# Patient Record
Sex: Female | Born: 1995
Health system: Southern US, Community
[De-identification: ages and names within clinical notes are randomized; demographics above are authoritative.]

## PROBLEM LIST (undated history)

## (undated) DIAGNOSIS — O039 Complete or unspecified spontaneous abortion without complication: Secondary | ICD-10-CM

## (undated) DIAGNOSIS — Z9109 Other allergy status, other than to drugs and biological substances: Secondary | ICD-10-CM

## (undated) DIAGNOSIS — J45909 Unspecified asthma, uncomplicated: Secondary | ICD-10-CM

## (undated) HISTORY — DX: Complete or unspecified spontaneous abortion without complication: O03.9

## (undated) HISTORY — PX: NO PAST SURGERIES: SHX2092

## (undated) HISTORY — DX: Unspecified asthma, uncomplicated: J45.909

---
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
## 2017-01-22 DIAGNOSIS — H52223 Regular astigmatism, bilateral: Secondary | ICD-10-CM | POA: Diagnosis not present

## 2017-01-22 DIAGNOSIS — H5203 Hypermetropia, bilateral: Secondary | ICD-10-CM | POA: Diagnosis not present

## 2017-03-02 ENCOUNTER — Telehealth: Payer: Self-pay | Admitting: *Deleted

## 2017-03-02 MED ORDER — HYDROCODONE-HOMATROPINE 5-1.5 MG/5ML PO SYRP: ORAL_SOLUTION | ORAL | 0 refills | Status: DC

## 2017-03-02 MED ORDER — OSELTAMIVIR PHOSPHATE 75 MG PO CAPS: 75.0000 mg | ORAL_CAPSULE | Freq: Two times a day (BID) | ORAL | 0 refills | Status: AC

## 2017-03-02 NOTE — Telephone Encounter (Signed)
Mother is aware of all. 

## 2017-03-02 NOTE — Telephone Encounter (Signed)
tamiflu 75 bid for five d, calandra knows it is our po0licy to call in tamiflu if others in the immed family have been recently diagnosed, hycodan 3 oz one tspn qhs prn cough

## 2017-03-02 NOTE — Telephone Encounter (Signed)
I called and left a message asked that mother r/c.I have sent in the Tamiful to [facility] and hycodan is printed awaiting signature from Dr. Lubertha SouthSteve Luking.

## 2017-03-02 NOTE — Telephone Encounter (Signed)
Mother calandra calling to ask if tamilfu and cough syrup can be prescribed. Family recently in with flu. Having cough, bodyaches, low grade fever, coughing til chest hurts. Started yesterday.  [facility] [address]

## 2017-12-18 ENCOUNTER — Ambulatory Visit: Payer: 59 | Admitting: Family Medicine

## 2017-12-18 ENCOUNTER — Encounter: Payer: Self-pay | Admitting: Family Medicine

## 2017-12-18 VITALS — BP 120/70 | Temp 98.8°F | Ht 67.5 in | Wt 125.4 lb

## 2017-12-18 DIAGNOSIS — J111 Influenza due to unidentified influenza virus with other respiratory manifestations: Secondary | ICD-10-CM | POA: Diagnosis not present

## 2017-12-18 MED ORDER — OSELTAMIVIR PHOSPHATE 75 MG PO CAPS: 75.0000 mg | ORAL_CAPSULE | Freq: Two times a day (BID) | ORAL | 0 refills | Status: AC

## 2017-12-18 NOTE — Progress Notes (Signed)
   Subjective:    Patient ID: Emily [name], female    DOB: 06/28/1995, 22 y.o.   MRN: [id number]  Cough  This is a new problem. The current episode started yesterday. The cough is non-productive. Associated symptoms include headaches and a sore throat. Associated symptoms comments: Fatigue, body aches. She has tried nothing for the symptoms.    Throat as hurting    Real bad achiness   Cough off and on , dry    This energy level not good   Appetite   Not any    Results for orders placed or performed in visit on 09/22/16  Urine Culture  Result Value Ref Range   Urine Culture, Routine Final report (A)    Organism ID, Bacteria Klebsiella pneumoniae (A)    Antimicrobial Susceptibility Comment   POCT urinalysis dipstick  Result Value Ref Range   Color, UA     Clarity, UA     Glucose, UA     Bilirubin, UA     Ketones, UA     Spec Grav, UA 1.015 1.010 - 1.025   Blood, UA     pH, UA 6.0 5.0 - 8.0   Protein, UA     Urobilinogen, UA  0.2 or 1.0 E.U./dL   Nitrite, UA     Leukocytes, UA  Negative      Did get a flu shot this   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   Respiratory: Positive for cough.   Neurological: Positive for headaches.       Objective:   Physical Exam  Alert vitals reviewed, moderate malaise. Hydration good. Positive nasal congestion lungs no crackles or wheezes, no tachypnea, intermittent bronchial cough during exam heart regular rate and rhythm.       Assessment & Plan:  Impression influenza discussed at length. Ashby Dawesature of illness and potential sequela discussed. Plan Tamiflu prescribed if indicated and timing appropriate. Symptom care discussed. Warning signs discussed. WSL

## 2018-06-17 ENCOUNTER — Other Ambulatory Visit: Payer: Self-pay

## 2018-06-17 DIAGNOSIS — Z20822 Contact with and (suspected) exposure to covid-19: Secondary | ICD-10-CM

## 2018-06-22 LAB — NOVEL CORONAVIRUS, NAA: SARS-CoV-2, NAA: NOT DETECTED

## 2018-06-25 ENCOUNTER — Ambulatory Visit (INDEPENDENT_AMBULATORY_CARE_PROVIDER_SITE_OTHER): Payer: 59 | Admitting: Certified Nurse Midwife

## 2018-06-25 ENCOUNTER — Encounter: Payer: Self-pay | Admitting: Certified Nurse Midwife

## 2018-06-25 ENCOUNTER — Other Ambulatory Visit: Payer: Self-pay

## 2018-06-25 VITALS — BP 110/70 | HR 68 | Temp 97.8°F | Resp 16 | Ht 67.0 in | Wt 126.0 lb

## 2018-06-25 DIAGNOSIS — Z30011 Encounter for initial prescription of contraceptive pills: Secondary | ICD-10-CM | POA: Diagnosis not present

## 2018-06-25 DIAGNOSIS — Z01419 Encounter for gynecological examination (general) (routine) without abnormal findings: Secondary | ICD-10-CM

## 2018-06-25 DIAGNOSIS — Z113 Encounter for screening for infections with a predominantly sexual mode of transmission: Secondary | ICD-10-CM

## 2018-06-25 MED ORDER — NORETHIN ACE-ETH ESTRAD-FE 1-20 MG-MCG(24) PO TABS: 1.0000 | ORAL_TABLET | Freq: Every day | ORAL | 3 refills | Status: DC

## 2018-06-25 NOTE — Progress Notes (Signed)
23 y.o. G0P0000 Single  African American Fe here to establish gyn care and  for annual exam. Periods monthly, duration 5 days with light flow,mild cramping with Advil use with good results. No missed cycles in last year. Contraception condoms consistent (tries). Has read about OCP and desires to start on. Has read about OCP and feels this is good choice. Desires STD screening, first partner, but he has been sexually active. No other health issues today.  Patient's last menstrual period was 05/29/2018 (exact date).          Sexually active: Yes.    The current method of family planning is condoms most of the time.    Exercising: No.  exercise Smoker:  no  Review of Systems  Constitutional: Negative.   HENT: Negative.   Eyes: Negative.   Respiratory: Negative.   Cardiovascular: Negative.   Gastrointestinal: Negative.   Genitourinary: Negative.   Musculoskeletal: Negative.   Skin: Negative.   Neurological: Negative.   Endo/Heme/Allergies: Negative.   Psychiatric/Behavioral: Negative.     Health Maintenance: Pap:  none History of Abnormal Pap: no MMG:  none Self Breast exams: no Colonoscopy:  none BMD:   none TDaP:  2011 Shingles: no Pneumonia: no Hep C and HIV: not done Labs:  If needed   reports that she has never smoked. She has never used smokeless tobacco. She reports that she does not drink alcohol or use drugs.  Past Medical History:  Diagnosis Date  . Asthma    as a child  . Environmental allergies     History reviewed. No pertinent surgical history.  Current Outpatient Medications  Medication Sig Dispense Refill  . cetirizine (ZYRTEC) 10 MG tablet TAKE 1 TABLET BY MOUTH DAILY. 30 tablet 5  . olopatadine (PATANOL) 0.1 % ophthalmic solution Place 1 drop into both eyes 2 (two) times daily.    . Olopatadine HCl (PATADAY) 0.2 % SOLN One drop each eye once a day prn allergies 2.5 mL 11   No current facility-administered medications for this visit.     Family  History  Problem Relation Age of Onset  . Hypertension Father   . Heart attack Paternal Grandfather     ROS:  Pertinent items are noted in HPI.  Otherwise, a comprehensive ROS was negative.  Exam:   BP 110/70   Pulse 68   Temp 97.8 F (36.6 C) (Skin)   Resp 16   Ht 5\' 7"  (1.702 m)   Wt 126 lb (57.2 kg)   LMP 05/29/2018 (Exact Date)   BMI 19.73 kg/m  Height: 5\' 7"  (170.2 cm) Ht Readings from Last 3 Encounters:  06/25/18 5\' 7"  (1.702 m)  12/18/17 5' 7.5" (1.715 m)  12/09/16 5' 7.5" (1.715 m) (90 %, Z= 1.27)*   * Growth percentiles are based on CDC (Girls, 2-20 Years) data.    General appearance: alert, cooperative and appears stated age Head: Normocephalic, without obvious abnormality, atraumatic Neck: no adenopathy, supple, symmetrical, trachea midline and thyroid normal to inspection and palpation Lungs: clear to auscultation bilaterally Breasts: normal appearance, no masses or tenderness, No nipple retraction or dimpling, No nipple discharge or bleeding, No axillary or supraclavicular adenopathy, Taught monthly breast self examination Heart: regular rate and rhythm Abdomen: soft, non-tender; no masses,  no organomegaly Extremities: extremities normal, atraumatic, no cyanosis or edema Skin: Skin color, texture, turgor normal. No rashes or lesions Lymph nodes: Cervical, supraclavicular, and axillary nodes normal. No abnormal inguinal nodes palpated Neurologic: Grossly normal   Pelvic: External  genitalia:  no lesions              Urethra:  normal appearing urethra with no masses, tenderness or lesions              Bartholin's and Skene's: normal                 Vagina: normal appearing vagina with normal color and discharge, no lesions              Cervix: no cervical motion tenderness, no lesions and nulliparous appearance              Pap taken: No. Bimanual Exam:  Uterus:  normal size, contour, position, consistency, mobility, non-tender and anteverted               Adnexa: normal adnexa and no mass, fullness, tenderness               Rectovaginal: Confirms               Anus:  normal appearance  Chaperone present: yes  A:  Well Woman with normal exam  Contraception condoms desires OCP   STD screening  P:   Reviewed health and wellness pertinent to exam   Discussed risks/benefits/warning signs and expectations of OCP and importance of consistent use. Desires RX  Rx Loestrin 24 Fe with instructions and need follow up visit in 3 months to assess  Labs: GC/chlamydia, affirm, HIV, RPR, Hep C  Pap smear: no at 23   counseled on breast self exam, STD prevention, HIV risk factors and prevention, feminine hygiene, use and side effects of OCP's, adequate intake of calcium and vitamin D, diet and exercise  return annually or prn  An After Visit Summary was printed and given to the patient.

## 2018-06-26 LAB — VAGINITIS/VAGINOSIS, DNA PROBE
Candida Species: NEGATIVE
Gardnerella vaginalis: NEGATIVE
Trichomonas vaginosis: NEGATIVE

## 2018-06-27 LAB — HEP, RPR, HIV PANEL
HIV Screen 4th Generation wRfx: NONREACTIVE
Hepatitis B Surface Ag: NEGATIVE
RPR Ser Ql: NONREACTIVE

## 2018-06-28 LAB — GC/CHLAMYDIA PROBE AMP
Chlamydia trachomatis, NAA: NEGATIVE
Neisseria Gonorrhoeae by PCR: NEGATIVE

## 2018-07-21 ENCOUNTER — Other Ambulatory Visit: Payer: Self-pay

## 2018-07-21 ENCOUNTER — Ambulatory Visit (INDEPENDENT_AMBULATORY_CARE_PROVIDER_SITE_OTHER): Payer: 59 | Admitting: Nurse Practitioner

## 2018-07-21 ENCOUNTER — Encounter: Payer: Self-pay | Admitting: Nurse Practitioner

## 2018-07-21 VITALS — BP 118/68 | Temp 97.6°F | Ht 67.5 in | Wt 127.0 lb

## 2018-07-21 DIAGNOSIS — Z1159 Encounter for screening for other viral diseases: Secondary | ICD-10-CM

## 2018-07-21 DIAGNOSIS — Z111 Encounter for screening for respiratory tuberculosis: Secondary | ICD-10-CM | POA: Diagnosis not present

## 2018-07-21 DIAGNOSIS — Z0000 Encounter for general adult medical examination without abnormal findings: Secondary | ICD-10-CM | POA: Diagnosis not present

## 2018-07-21 LAB — POCT URINALYSIS DIPSTICK
Bilirubin, UA: POSITIVE
Blood, UA: POSITIVE
Spec Grav, UA: 1.015 (ref 1.010–1.025)
Urobilinogen, UA: 2 E.U./dL — AB
pH, UA: 5 (ref 5.0–8.0)

## 2018-07-21 NOTE — Progress Notes (Signed)
Subjective:    Patient ID: Emily [name], female    DOB: 1995/06/12, 23 y.o.   MRN: [id number]  HPI The patient comes in today for a wellness visit.    A review of their health history was completed.  A review of medications was also completed.  Any needed refills; none  Eating habits: health conscious  Falls/  MVA accidents in past few months: none  Regular exercise: none  Specialist pt sees on regular basis: none  Preventative health issues were discussed.   Additional concerns: none  Presents for her examination and paperwork to go to [employer].  Healthy diet.  Has an active job but otherwise no exercise.  Just had her female physical with gynecology, is on her first pack of a new oral contraceptive.  Regular vision and dental exams.  Review of Systems  Constitutional: Negative for activity change, appetite change, fatigue and fever.  HENT: Negative for dental problem, ear pain, sinus pressure and sore throat.   Respiratory: Negative for cough, chest tightness, shortness of breath and wheezing.   Cardiovascular: Negative for chest pain.  Gastrointestinal: Negative for abdominal distention, abdominal pain, constipation, diarrhea, nausea and vomiting.  Genitourinary: Negative for difficulty urinating, dysuria, enuresis, frequency, genital sores, pelvic pain, urgency and vaginal discharge.  Psychiatric/Behavioral: The patient is nervous/anxious.        Mild anxiety that is not an issue at this time.   Depression screen [facility] 2/9 07/21/2018 07/21/2018  Decreased Interest 1 0  Down, Depressed, Hopeless 0 0  PHQ - 2 Score 1 0        Objective:   Physical Exam Constitutional:      General: She is not in acute distress.    Appearance: Normal appearance. She is well-developed.  HENT:     Right Ear: External ear normal.     Left Ear: External ear normal.     Mouth/Throat:     Mouth: Mucous membranes are moist.     Pharynx: Oropharynx is clear.   Neck:     Musculoskeletal: Normal range of motion and neck supple.     Thyroid: No thyromegaly.     Trachea: No tracheal deviation.  Cardiovascular:     Rate and Rhythm: Normal rate and regular rhythm.     Heart sounds: Normal heart sounds. No murmur. No gallop.   Pulmonary:     Effort: Pulmonary effort is normal.     Breath sounds: Normal breath sounds.  Abdominal:     General: There is no distension.     Palpations: Abdomen is soft.     Tenderness: There is no abdominal tenderness.  Genitourinary:    Vagina: Normal.     Comments: Breast and GU exams deferred, recently done by gynecology. Musculoskeletal: Normal range of motion.  Lymphadenopathy:     Cervical: No cervical adenopathy.  Skin:    General: Skin is warm and dry.     Findings: No rash.  Neurological:     Mental Status: She is alert and oriented to person, place, and time.     Coordination: Coordination normal.     Gait: Gait normal.     Deep Tendon Reflexes: Reflexes normal.  Psychiatric:        Mood and Affect: Mood normal.        Behavior: Behavior normal.        Thought Content: Thought content normal.        Judgment: Judgment normal.  Assessment & Plan:     ICD-10-CM   1. Routine general medical examination at a health care facility  Z00.00 POCT urinalysis dipstick  2. Screening for tuberculosis  Z11.1 TB Skin Test  3. Screening for rubella  Z11.59 Rubella screen   Immunizations and health maintenance for her age are up-to-date.  Discussed healthy lifestyle and safe sex issues.  Paperwork completed for school.

## 2018-07-22 ENCOUNTER — Encounter: Payer: Self-pay | Admitting: Nurse Practitioner

## 2018-07-23 DIAGNOSIS — Z1159 Encounter for screening for other viral diseases: Secondary | ICD-10-CM | POA: Diagnosis not present

## 2018-07-23 LAB — TB SKIN TEST
Induration: 0 mm
TB Skin Test: NEGATIVE

## 2018-07-24 LAB — RUBELLA SCREEN: Rubella Antibodies, IGG: 5.33 index (ref >0.99)

## 2018-08-04 ENCOUNTER — Other Ambulatory Visit: Payer: Self-pay

## 2018-08-04 ENCOUNTER — Ambulatory Visit (INDEPENDENT_AMBULATORY_CARE_PROVIDER_SITE_OTHER): Payer: 59 | Admitting: Family Medicine

## 2018-08-04 DIAGNOSIS — Z111 Encounter for screening for respiratory tuberculosis: Secondary | ICD-10-CM

## 2018-08-06 LAB — TB SKIN TEST
Induration: 0 mm
TB Skin Test: NEGATIVE

## 2018-09-14 ENCOUNTER — Other Ambulatory Visit: Payer: Self-pay

## 2018-09-15 ENCOUNTER — Other Ambulatory Visit: Payer: Self-pay

## 2018-09-15 ENCOUNTER — Encounter: Payer: Self-pay | Admitting: Certified Nurse Midwife

## 2018-09-15 ENCOUNTER — Ambulatory Visit (INDEPENDENT_AMBULATORY_CARE_PROVIDER_SITE_OTHER): Payer: 59 | Admitting: Certified Nurse Midwife

## 2018-09-15 VITALS — BP 104/60 | HR 64 | Temp 97.1°F | Resp 16 | Wt 129.0 lb

## 2018-09-15 DIAGNOSIS — Z3041 Encounter for surveillance of contraceptive pills: Secondary | ICD-10-CM

## 2018-09-15 MED ORDER — NORETHIN ACE-ETH ESTRAD-FE 1-20 MG-MCG(24) PO TABS: 1.0000 | ORAL_TABLET | Freq: Every day | ORAL | 9 refills | Status: DC

## 2018-09-15 NOTE — Progress Notes (Signed)
Review of Systems  Constitutional: Negative.   HENT: Negative.   Eyes: Negative.   Respiratory: Negative.   Cardiovascular: Negative.   Gastrointestinal: Negative.   Genitourinary: Negative.   Musculoskeletal: Negative.   Skin: Negative.   Neurological: Negative.   Endo/Heme/Allergies: Negative.   Psychiatric/Behavioral: Negative.     23 y.o. Single African American G0P0000 here for evaluation of OCP initiated on 06/25/2018. Menses monthly, light flow days only now from moderate flow previously and no cramping.  No medication needed for cramping now. Patient taking medication as prescribed. Denies missed pills, headaches, nausea, DVT warning signs or symptoms,  breakthrough bleeding, or other changes. Keeping menses calendar and keeping up with taking per pack instructions. Uses alarm on phone as reminder and has not missed any pills. Sexually active no partner change. Happy with choice and education given regarding contraception choice. Back at school, some in class time now. No other health issues today  O: Healthy female, WD WN Affect: normal orientation X 3    A: History of dysmenorrhea with periods, contraception desired, Loestrin 24 working well. No contraindication to continued use.  P: Reviewed warning signs with use again and need to advise if occurs. Stressed consistent use for best response with use. Questions addressed. Rx Loestrin 24 see instructions sent to pharmacy.  Rv prn and aex   15 minutes spent with patient in face to face counseling.

## 2018-09-15 NOTE — Patient Instructions (Signed)
Oral Contraception Use Oral contraceptive pills (OCPs) are medicines that you take to prevent pregnancy. OCPs work by:  Preventing the ovaries from releasing eggs.  Thickening mucus in the lower part of the uterus (cervix), which prevents sperm from entering the uterus.  Thinning the lining of the uterus (endometrium), which prevents a fertilized egg from attaching to the endometrium. OCPs are highly effective when taken exactly as prescribed. However, OCPs do not prevent sexually transmitted infections (STIs). Safe sex practices, such as using condoms while on an OCP, can help prevent STIs. Before taking OCPs, you may have a physical exam, blood test, and Pap test. A Pap test involves taking a sample of cells from your cervix to check for cancer. Discuss with your health care provider the possible side effects of the OCP you may be prescribed. When you start an OCP, be aware that it can take 2-3 months for your body to adjust to changes in hormone levels. How to take oral contraceptive pills Follow instructions from your health care provider about how to start taking your first cycle of OCPs. Your health care provider may recommend that you:  Start the pill on day 1 of your menstrual period. If you start at this time, you will not need any backup form of birth control (contraception), such as condoms.  Start the pill on the first Sunday after your menstrual period or on the day you get your prescription. In these cases, you will need to use backup contraception for the first week.  Start the pill at any time of your cycle. ? If you take the pill within 5 days of the start of your period, you will not need a backup form of contraception. ? If you start at any other time of your menstrual cycle, you will need to use another form of contraception for 7 days. If your OCP is the type called a minipill, it will protect you from pregnancy after taking it for 2 days (48 hours), and you can stop using  backup contraception after that time. After you have started taking OCPs:  If you forget to take 1 pill, take it as soon as you remember. Take the next pill at the regular time.  If you miss 2 or more pills, call your health care provider. Different pills have different instructions for missed doses. Use backup birth control until your next menstrual period starts.  If you use a 28-day pack that contains inactive pills and you miss 1 of the last 7 pills (pills with no hormones), throw away the rest of the non-hormone pills and start a new pill pack. No matter which day you start the OCP, you will always start a new pack on that same day of the week. Have an extra pack of OCPs and a backup contraceptive method available in case you miss some pills or lose your OCP pack. Follow these instructions at home:  Do not use any products that contain nicotine or tobacco, such as cigarettes and e-cigarettes. If you need help quitting, ask your health care provider.  Always use a condom to protect against STIs. OCPs do not protect against STIs.  Use a calendar to mark the days of your menstrual period.  Read the information and directions that came with your OCP. Talk to your health care provider if you have questions. Contact a health care provider if:  You develop nausea and vomiting.  You have abnormal vaginal discharge or bleeding.  You develop a rash.    You miss your menstrual period. Depending on the type of OCP you are taking, this may be a sign of pregnancy. Ask your health care provider for more information.  You are losing your hair.  You need treatment for mood swings or depression.  You get dizzy when taking the OCP.  You develop acne after taking the OCP.  You become pregnant or think you may be pregnant.  You have diarrhea, constipation, and abdominal pain or cramps.  You miss 2 or more pills. Get help right away if:  You develop chest pain.  You develop shortness of  breath.  You have an uncontrolled or severe headache.  You develop numbness or slurred speech.  You develop visual or speech problems.  You develop pain, redness, and swelling in your legs.  You develop weakness or numbness in your arms or legs. Summary  Oral contraceptive pills (OCPs) are medicines that you take to prevent pregnancy.  OCPs do not prevent sexually transmitted infections (STIs). Always use a condom to protect against STIs.  When you start an OCP, be aware that it can take 2-3 months for your body to adjust to changes in hormone levels.  Read all the information and directions that come with your OCP. This information is not intended to replace advice given to you by your health care provider. Make sure you discuss any questions you have with your health care provider. Document Released: 12/12/2010 Document Revised: 04/16/2018 Document Reviewed: 02/04/2016 Elsevier Patient Education  2020 [employer]vier [employer].  

## 2019-01-08 ENCOUNTER — Emergency Department (HOSPITAL_COMMUNITY): Payer: 59

## 2019-01-08 ENCOUNTER — Encounter (HOSPITAL_COMMUNITY): Payer: Self-pay | Admitting: Emergency Medicine

## 2019-01-08 ENCOUNTER — Emergency Department (HOSPITAL_COMMUNITY)
Admission: EM | Admit: 2019-01-08 | Discharge: 2019-01-08 | Disposition: A | Discharge status: Home / Self Care | Payer: 59 | Attending: Emergency Medicine | Admitting: Emergency Medicine

## 2019-01-08 ENCOUNTER — Other Ambulatory Visit: Payer: Self-pay

## 2019-01-08 DIAGNOSIS — O039 Complete or unspecified spontaneous abortion without complication: Secondary | ICD-10-CM | POA: Diagnosis not present

## 2019-01-08 DIAGNOSIS — O209 Hemorrhage in early pregnancy, unspecified: Secondary | ICD-10-CM | POA: Diagnosis present

## 2019-01-08 DIAGNOSIS — J45909 Unspecified asthma, uncomplicated: Secondary | ICD-10-CM | POA: Insufficient documentation

## 2019-01-08 DIAGNOSIS — O468X1 Other antepartum hemorrhage, first trimester: Secondary | ICD-10-CM

## 2019-01-08 DIAGNOSIS — O4690 Antepartum hemorrhage, unspecified, unspecified trimester: Secondary | ICD-10-CM

## 2019-01-08 DIAGNOSIS — O418X10 Other specified disorders of amniotic fluid and membranes, first trimester, not applicable or unspecified: Secondary | ICD-10-CM

## 2019-01-08 LAB — URINALYSIS, ROUTINE W REFLEX MICROSCOPIC
Bilirubin Urine: NEGATIVE
Glucose, UA: NEGATIVE mg/dL
Ketones, ur: NEGATIVE mg/dL
Leukocytes,Ua: NEGATIVE
Nitrite: NEGATIVE
Protein, ur: NEGATIVE mg/dL
Specific Gravity, Urine: 1.005 (ref 1.005–1.030)
pH: 8 (ref 5.0–8.0)

## 2019-01-08 LAB — WET PREP, GENITAL
Sperm: NONE SEEN
Trich, Wet Prep: NONE SEEN
Yeast Wet Prep HPF POC: NONE SEEN

## 2019-01-08 LAB — HCG, QUANTITATIVE, PREGNANCY: hCG, Beta Chain, Quant, S: 4579 m[IU]/mL — ABNORMAL HIGH (ref <5)

## 2019-01-08 LAB — PREGNANCY, URINE: Preg Test, Ur: POSITIVE — AB

## 2019-01-08 NOTE — ED Notes (Signed)
To US

## 2019-01-08 NOTE — ED Notes (Signed)
Returned from [place]  Per [place] tech pt miscarried while being scanned  POC taken to lab for preservation  PA informed

## 2019-01-08 NOTE — ED Notes (Signed)
POC to lab for formalin  POC returned to NS   PA informed

## 2019-01-08 NOTE — ED Provider Notes (Signed)
[facility] EMERGENCY DEPARTMENT Provider Note   CSN: [id number] Arrival date & time: 01/08/19  1211     History Chief Complaint  Patient presents with  . Vaginal Bleeding    Emily [name] is a 24 y.o. female presents to ER for concern of having a miscarriage.  LMP November 22 2018.  On Monday 12/28 has 2 positive at home pregnancy tests.  Last night noticed vaginal bleeding described as streaks of dark brown and light pink bleeding with wiping.  She urinated this morning and when she wiped she noticed brighter red streaks.  She has a liner now but no more bleeding.  Has associated pelvic cramping since last month that worsened yesterday. Cramping in lower generalized mild and intermittent, none currently.  Mild nausea and dizziness on Monday but none since.  Unplanned pregnancy.  First pregnancy.  She was on loestrin but stopped this after her last period.  She denies fevers. No UTI symptoms. No changes in BM. No pelvic or abdominal surgeries. No known h/o ovarian cysts, endometriosis, fibroids.  No Cp, Sob, light headedness. No modifying factors. No interventions.  HPI     Past Medical History:  Diagnosis Date  . Asthma    as a child  . Environmental allergies     There are no problems to display for this patient.   History reviewed. No pertinent surgical history.   OB History    Gravida  1   Para  0   Term  0   Preterm  0   AB  0   Living  0     SAB  0   TAB  0   Ectopic  0   Multiple  0   Live Births  0           Family History  Problem Relation Age of Onset  . Hypertension Father   . Heart attack Paternal Grandfather     Social History   Tobacco Use  . Smoking status: Never Smoker  . Smokeless tobacco: Never Used  Substance Use Topics  . Alcohol use: No  . Drug use: No    Home Medications Prior to Admission medications   Medication Sig Start Date End Date Taking? Authorizing Provider  cetirizine (ZYRTEC) 10 MG tablet TAKE 1 TABLET  BY MOUTH DAILY. Patient not taking: Reported on 01/08/2019 03/26/16   Merlyn Albert, MD  Norethindrone Acetate-Ethinyl Estrad-FE (LOESTRIN 24 FE) 1-20 MG-MCG(24) tablet Take 1 tablet by mouth daily. supply with  3 refills if insurance allows Patient not taking: Reported on 01/08/2019 09/15/18   Verner Chol, CNM    Allergies    Patient has no known allergies.  Review of Systems   Review of Systems  Genitourinary: Positive for menstrual problem, pelvic pain and vaginal bleeding.  All other systems reviewed and are negative.   Physical Exam Updated Vital Signs BP 115/69 (BP Location: Right Arm)   Pulse 86   Temp 97.8 F (36.6 C) (Oral)   Resp 16   Ht 5\' 8"  (1.727 m)   Wt 59 kg   LMP 11/22/2018   SpO2 97%   BMI 19.77 kg/m   Physical Exam Vitals and nursing note reviewed. Exam conducted with a chaperone present.  Constitutional:      Appearance: She is well-developed.     Comments: Non toxic in NAD  HENT:     Head: Normocephalic and atraumatic.     Nose: Nose normal.  Eyes:  Conjunctiva/sclera: Conjunctivae normal.  Cardiovascular:     Rate and Rhythm: Normal rate and regular rhythm.  Pulmonary:     Effort: Pulmonary effort is normal.     Breath sounds: Normal breath sounds.  Abdominal:     General: Bowel sounds are normal.     Palpations: Abdomen is soft.     Tenderness: There is no abdominal tenderness.     Comments: No lower abd/pelvic tenderness. No G/R/R.   Genitourinary:    Cervix: Cervical bleeding present.     Comments:  Exam performed with RN at bedside for assistance. External genitalia without lesions.  No groin lymphadenopathy.  Vaginal mucosa and cervix pink without lesions.  Cervical os visualized slightly open with blood coming through.  No friability or cervical lesions, polyps, tears or other abnormalities. Moderate amount of dark blood in vaginal vault.  No CMT.  Nonpalpable, nontender adnexa.  Perianal skin normal without  lesions. Musculoskeletal:        General: Normal range of motion.     Cervical back: Normal range of motion.  Skin:    General: Skin is warm and dry.     Capillary Refill: Capillary refill takes less than 2 seconds.  Neurological:     Mental Status: She is alert.  Psychiatric:        Behavior: Behavior normal.     ED Results / Procedures / Treatments   Labs (all labs ordered are listed, but only abnormal results are displayed) Labs Reviewed  WET PREP, GENITAL - Abnormal; Notable for the following components:      Result Value   Clue Cells Wet Prep HPF POC PRESENT (*)    WBC, Wet Prep HPF POC PRESENT (*)    All other components within normal limits  URINALYSIS, ROUTINE W REFLEX MICROSCOPIC - Abnormal; Notable for the following components:   Color, Urine STRAW (*)    Hgb urine dipstick MODERATE (*)    Bacteria, UA RARE (*)    All other components within normal limits  PREGNANCY, URINE - Abnormal; Notable for the following components:   Preg Test, Ur POSITIVE (*)    All other components within normal limits  HCG, QUANTITATIVE, PREGNANCY - Abnormal; Notable for the following components:   hCG, Beta Chain, Quant, S 4,579 (*)    All other components within normal limits  URINE CULTURE  GC/CHLAMYDIA PROBE AMP () NOT AT [place]    EKG None  Radiology US OB LESS THAN 14 WEEKS WITH OB TRANSVAGINAL  Result Date: 01/08/2019 CLINICAL DATA:  Pregnancy, vaginal bleeding. EXAM: OBSTETRIC <14 WK [place] AND TRANSVAGINAL OB US TECHNIQUE: Both transabdominal and transvaginal ultrasound examinations were performed for complete evaluation of the gestation as well as the maternal uterus, adnexal regions, and pelvic cul-de-sac. Transvaginal technique was performed to assess early pregnancy. COMPARISON:  None. FINDINGS: Intrauterine gestational sac and embryo: Gestational sac and probable fetal pole were noted within the endometrial space on the initial transabdominal  images. However, during transvaginal imaging, these are seen to be moving toward the lower uterine segment. The technologist reports that these contents were expelled during the exam, and they were collected and delivered to pathology. Post imaging demonstrates no residual gestational sac within the endometrial space. Yolk sac:  Not Visualized. Cardiac Activity: Not Visualized. Subchorionic hemorrhage:  Large subchorionic hemorrhage is noted. Maternal uterus/adnexae: Ovaries are unremarkable. Trace amount of free fluid is noted in the cholestatic which most likely is physiologic. IMPRESSION: Probable gestational sac and possible  fetal pole were initially identified within the endometrial space, but were eventually expelled during the course of this examination concerning for spontaneous abortion. Large subchorionic hemorrhage is noted. The expelled endometrial contents were reportedly collected and sent to pathology. Electronically Signed   By: Lupita Raider M.D.   On: 01/08/2019 15:35    Procedures Procedures (including critical care time)  Medications Ordered in ED Medications - No data to display  ED Course  I have reviewed the triage vital signs and the nursing notes.  Pertinent labs & imaging results that were available during my care of the patient were reviewed by me and considered in my medical decision making (see chart for details).  Clinical Course as of Jan 08 1748  Sat Jan 08, 2019  1409 RN had pt obtain self swab for wet prep, wet prep swab recollected by me during pelvic exam   Wet prep, genital(!) [CG]  1747 Probable gestational sac and possible fetal pole were initially identified within the endometrial space, but were eventually expelled during the course of this examination concerning for spontaneous abortion. Large subchorionic hemorrhage is noted. The expelled endometrial contents were reportedly collected and sent to pathology.   US OB LESS THAN 14 WEEKS WITH OB  TRANSVAGINAL [CG]    Clinical Course User Index [CG] Liberty Handy, PA-C   MDM Rules/Calculators/A&P                      Positive pregnancy test. HCG 4579.  TVUS showed initially gestational sac and possible fetal pole but during ultrasound patient expelled POC.  POC collected and sent to pathology.  On repeat examination no GS noted.  Large subchorionic hemorrhage was noted as well.  UA without signs of infection. Wet prep has clue cells but pt has not had any symptoms of BV and will defer treatment at this time.    Pt has no abd tenderness, HD stability, fever or signs to suggest heavy vaginal hemorrhage to warrant more blood work or imaging.   Explained findings to patient most suggestive of spontaneous miscarriage.    She is to f/u with OB in the next 2-3 days for repeat HCG levels, reassessment. She has scheduled appointment next Wednesday.  Explain likely some vaginal bleeding over the next few days, recommended pelvic rest, NSAIDs. Return precautions given. She is in agreement with POC.   Final Clinical Impression(s) / ED Diagnoses Final diagnoses:  Vaginal bleeding in pregnancy  Spontaneous miscarriage  Subchorionic hematoma in first trimester, single or unspecified fetus    Rx / DC Orders ED Discharge Orders    None       Jerrell Mylar 01/08/19 1750    Benjiman Core, MD 01/09/19 [phone]

## 2019-01-08 NOTE — ED Triage Notes (Signed)
Pt states she had positive pregnancy test on Monday and started spotting last night with light bleeding today.

## 2019-01-08 NOTE — Discharge Instructions (Signed)
You were seen in the ER for vaginal bleeding  Ultrasound is highly suggestive of spontaneous miscarriage  Products were sent to pathology  You need re-evaluation and repeat HCG labs in the next 2-4 days, follow up with your OB appointment  Return to ER for worsening bleeding, pain, fever, chest pain shortness of breath passing out   You may check on today's work up and labs on [employer]

## 2019-01-10 LAB — URINE CULTURE: Culture: NO GROWTH

## 2019-01-10 LAB — GC/CHLAMYDIA PROBE AMP (~~LOC~~) NOT AT ARMC
Chlamydia: NEGATIVE
Neisseria Gonorrhea: NEGATIVE

## 2019-01-11 LAB — SURGICAL PATHOLOGY

## 2019-01-12 ENCOUNTER — Encounter: Payer: Self-pay | Admitting: Adult Health

## 2019-01-12 ENCOUNTER — Other Ambulatory Visit: Payer: Self-pay

## 2019-01-12 ENCOUNTER — Ambulatory Visit (INDEPENDENT_AMBULATORY_CARE_PROVIDER_SITE_OTHER): Payer: 59 | Admitting: Adult Health

## 2019-01-12 VITALS — BP 115/75 | HR 83 | Ht 68.0 in | Wt 128.0 lb

## 2019-01-12 DIAGNOSIS — O039 Complete or unspecified spontaneous abortion without complication: Secondary | ICD-10-CM

## 2019-01-12 DIAGNOSIS — Z3201 Encounter for pregnancy test, result positive: Secondary | ICD-10-CM

## 2019-01-12 LAB — POCT URINE PREGNANCY: Preg Test, Ur: POSITIVE — AB

## 2019-01-12 NOTE — Progress Notes (Signed)
Patient ID: PALOMA GRANGE, female   DOB: 12-Mar-1995, 24 y.o.   MRN: [id number] History of Present Illness:  Dianelys is a 24 year old black female, single, G1P0010, here in follow up of being seen in ER 01/08/19 at [facility], [facility] was 4,579 and pathology was +POC, GC/CHL was negative.  PCP is [facility].   Current Medications, Allergies, Past Medical History, Past Surgical History, Family History and Social History were reviewed in [employer] record.     Review of Systems: Has bleeding still  No pain No sex since miscarriage    Physical Exam:BP 115/75 (BP Location: Left Arm, Patient Position: Sitting, Cuff Size: Normal)   Pulse 83   Ht 5\' 8"  (1.727 m)   Wt 128 lb (58.1 kg)   LMP 11/22/2018   Breastfeeding Unknown   BMI 19.46 kg/m UPT + General:  Well developed, well nourished, no acute distress Skin:  Warm and dry Neck:  Midline trachea, normal thyroid, good ROM, no lymphadenopathy Lungs; Clear to auscultation bilaterally  Cardiovascular: Regular rate and rhythm Pelvic:  External genitalia is normal in appearance, no lesions.  The vagina is normal in appearance,pink discharge with out odor. Urethra has no lesions or masses. The cervix is smooth and no CMT.  Uterus is felt to be normal size, shape, and contour.  No adnexal masses or tenderness noted.Bladder is non tender, no masses felt. Extremities/musculoskeletal:  No swelling or varicosities noted, no clubbing or cyanosis Psych:  No mood changes, alert and cooperative,seems happy Fall risk I slow PHQ 2 score 0. Examination chaperoned by 11/24/2018 LPN.  Impression and Plan: 1. Positive pregnancy test Discussed can be positive for a while after miscarriage   2. Miscarriage Check QHCG and ABO RH 01/17/19 No sex for now Will start OCs when [facility] drops further, she already has a pack  Follow up in 4 weeks or sooner if needed

## 2019-01-17 DIAGNOSIS — O039 Complete or unspecified spontaneous abortion without complication: Secondary | ICD-10-CM | POA: Diagnosis not present

## 2019-01-18 ENCOUNTER — Other Ambulatory Visit: Payer: Self-pay | Admitting: Adult Health

## 2019-01-18 DIAGNOSIS — O039 Complete or unspecified spontaneous abortion without complication: Secondary | ICD-10-CM

## 2019-01-18 LAB — ABO/RH: Rh Factor: POSITIVE

## 2019-01-18 LAB — BETA HCG QUANT (REF LAB): hCG Quant: 19 m[IU]/mL

## 2019-01-24 ENCOUNTER — Telehealth: Payer: 59 | Admitting: Physician Assistant

## 2019-01-24 DIAGNOSIS — R399 Unspecified symptoms and signs involving the genitourinary system: Secondary | ICD-10-CM | POA: Diagnosis not present

## 2019-01-24 DIAGNOSIS — R300 Dysuria: Secondary | ICD-10-CM

## 2019-01-24 MED ORDER — NITROFURANTOIN MONOHYD MACRO 100 MG PO CAPS: 100.0000 mg | ORAL_CAPSULE | Freq: Two times a day (BID) | ORAL | 0 refills | Status: AC

## 2019-01-24 NOTE — Progress Notes (Signed)

## 2019-02-02 ENCOUNTER — Encounter: Payer: Self-pay | Admitting: Family Medicine

## 2019-02-03 ENCOUNTER — Encounter: Payer: Self-pay | Admitting: Family Medicine

## 2019-02-08 DIAGNOSIS — Z23 Encounter for immunization: Secondary | ICD-10-CM | POA: Diagnosis not present

## 2019-02-10 ENCOUNTER — Ambulatory Visit (INDEPENDENT_AMBULATORY_CARE_PROVIDER_SITE_OTHER): Payer: 59 | Admitting: Adult Health

## 2019-02-10 ENCOUNTER — Other Ambulatory Visit: Payer: Self-pay

## 2019-02-10 ENCOUNTER — Encounter: Payer: Self-pay | Admitting: Adult Health

## 2019-02-10 VITALS — BP 112/72 | HR 86 | Ht 67.0 in | Wt 128.5 lb

## 2019-02-10 DIAGNOSIS — Z3201 Encounter for pregnancy test, result positive: Secondary | ICD-10-CM | POA: Diagnosis not present

## 2019-02-10 DIAGNOSIS — O039 Complete or unspecified spontaneous abortion without complication: Secondary | ICD-10-CM | POA: Diagnosis not present

## 2019-02-10 LAB — POCT URINE PREGNANCY: Preg Test, Ur: POSITIVE — AB

## 2019-02-10 NOTE — Progress Notes (Signed)
  Subjective:     Patient ID: Emily [name], female   DOB: [birth year], 24 y.o.   MRN: [id number]  HPI Emily [name] is a 24 year old black female, single, back in follow up on recent miscarriage and did not get QHCG  Last week.On 01/17/19 QHCG was 19 and blood type is A+. PCP is [facility].   Review of Systems Patient denies any headaches, hearing loss, fatigue, blurred vision, shortness of breath, chest pain, abdominal pain, problems with bowel movements, urination, or intercourse. No joint pain or mood swings. Reviewed past medical,surgical, social and family history. Reviewed medications and allergies.     Objective:   Physical Exam BP 112/72 (BP Location: Left Arm, Patient Position: Sitting, Cuff Size: Normal)   Pulse 86   Ht 5\' 7"  (1.702 m)   Wt 128 lb 8 oz (58.3 kg)   Breastfeeding No   BMI 20.13 kg/m UPT + Skin warm and dry. Lungs: clear to ausculation bilaterally. Cardiovascular: regular rate and rhythm.   Fall risk is low PHQ 2 score is 0.  Assessment:     1. Pregnancy examination or test, positive result   2. Miscarriage Check [facility] today, will communicate tomorrow when results back  Use condoms    Plan:     Follow up prn

## 2019-02-11 ENCOUNTER — Telehealth: Payer: Self-pay | Admitting: Adult Health

## 2019-02-11 DIAGNOSIS — Z3201 Encounter for pregnancy test, result positive: Secondary | ICD-10-CM

## 2019-02-11 LAB — BETA HCG QUANT (REF LAB): hCG Quant: 254 m[IU]/mL

## 2019-02-11 NOTE — Telephone Encounter (Signed)
Left message that [facility] was 254 which is elevated, from 19 3 weeks ago, need to recheck [facility] order is in, just go to the lab, looks like you got pregnant again, very soon

## 2019-02-14 DIAGNOSIS — Z3201 Encounter for pregnancy test, result positive: Secondary | ICD-10-CM | POA: Diagnosis not present

## 2019-02-15 ENCOUNTER — Other Ambulatory Visit: Payer: Self-pay | Admitting: Adult Health

## 2019-02-15 DIAGNOSIS — Z3201 Encounter for pregnancy test, result positive: Secondary | ICD-10-CM

## 2019-02-15 LAB — BETA HCG QUANT (REF LAB): hCG Quant: 1277 m[IU]/mL

## 2019-02-16 DIAGNOSIS — Z3201 Encounter for pregnancy test, result positive: Secondary | ICD-10-CM | POA: Diagnosis not present

## 2019-02-17 ENCOUNTER — Telehealth: Payer: Self-pay | Admitting: Adult Health

## 2019-02-17 LAB — BETA HCG QUANT (REF LAB): hCG Quant: 2841 m[IU]/mL

## 2019-02-17 NOTE — Telephone Encounter (Signed)
Patient called, requesting an appointment for an ultrasound.  Does she need one and if so, is it a dating?  [phone]

## 2019-02-21 ENCOUNTER — Other Ambulatory Visit: Payer: Self-pay | Admitting: Adult Health

## 2019-02-21 DIAGNOSIS — O3680X0 Pregnancy with inconclusive fetal viability, not applicable or unspecified: Secondary | ICD-10-CM

## 2019-02-22 ENCOUNTER — Other Ambulatory Visit: Payer: Self-pay

## 2019-02-22 ENCOUNTER — Ambulatory Visit (INDEPENDENT_AMBULATORY_CARE_PROVIDER_SITE_OTHER): Payer: 59

## 2019-02-22 DIAGNOSIS — Z3A01 Less than 8 weeks gestation of pregnancy: Secondary | ICD-10-CM | POA: Diagnosis not present

## 2019-02-22 DIAGNOSIS — O3680X0 Pregnancy with inconclusive fetal viability, not applicable or unspecified: Secondary | ICD-10-CM | POA: Diagnosis not present

## 2019-02-22 NOTE — Progress Notes (Signed)
[place] 5+5 wks single IUP with YS,FHR 97 bpm,normal ovaries,crl 2.29 mm

## 2019-02-25 ENCOUNTER — Telehealth: Payer: Self-pay | Admitting: *Deleted

## 2019-02-25 NOTE — Telephone Encounter (Signed)
I called patient back she states that she is seeing bleeding when she wipes. Pt denies any recent intercourse or vaginal exams. Advised that she monitor her symptoms and call us back if needed. Advised that she has IUP on ultrasound.

## 2019-03-19 ENCOUNTER — Encounter (HOSPITAL_COMMUNITY): Payer: Self-pay | Admitting: Emergency Medicine

## 2019-03-19 ENCOUNTER — Emergency Department (HOSPITAL_COMMUNITY)
Admission: EM | Admit: 2019-03-19 | Discharge: 2019-03-19 | Disposition: A | Discharge status: Home / Self Care | Payer: 59 | Attending: Emergency Medicine | Admitting: Emergency Medicine

## 2019-03-19 ENCOUNTER — Other Ambulatory Visit: Payer: Self-pay

## 2019-03-19 DIAGNOSIS — K0889 Other specified disorders of teeth and supporting structures: Secondary | ICD-10-CM | POA: Diagnosis not present

## 2019-03-19 DIAGNOSIS — Z3A09 9 weeks gestation of pregnancy: Secondary | ICD-10-CM | POA: Insufficient documentation

## 2019-03-19 DIAGNOSIS — O26891 Other specified pregnancy related conditions, first trimester: Secondary | ICD-10-CM | POA: Insufficient documentation

## 2019-03-19 DIAGNOSIS — O99891 Other specified diseases and conditions complicating pregnancy: Secondary | ICD-10-CM | POA: Diagnosis not present

## 2019-03-19 MED ORDER — AMOXICILLIN 250 MG PO CAPS
500.0000 mg | ORAL_CAPSULE | Freq: Once | ORAL | Status: AC
  Administered 2019-03-19: 500 mg via ORAL
  Filled 2019-03-19: qty 2

## 2019-03-19 MED ORDER — AMOXICILLIN 500 MG PO CAPS: 500.0000 mg | ORAL_CAPSULE | Freq: Three times a day (TID) | ORAL | 0 refills | Status: DC

## 2019-03-19 NOTE — ED Provider Notes (Signed)
[facility] EMERGENCY DEPARTMENT Provider Note   CSN: [id number] Arrival date & time: 03/19/19  1943     History Chief Complaint  Patient presents with  . Dental Pain    Emily [name] is a 24 y.o. female.  HPI      Emily [name] is a 24 y.o. female G2P0 at [redacted] weeks gestation, who presents to the Emergency Department complaining of left upper dental pain for one week, worse since last evening and now associated with mild swelling of the left face. symptoms began after a filling fell out.   She describes a sharp throbbing pain of her left cheek that radiates to into her left ear.  She has been taking Tylenol with some relief.  She is also use an over-the-counter temporary filling which has provided some relief of pain as well.  She denies fever, chills, difficulty swallowing or breathing, and neck pain.  She denies any pregnancy related symptoms.   Past Medical History:  Diagnosis Date  . Asthma    as a child  . Environmental allergies   . Miscarriage     Patient Active Problem List   Diagnosis Date Noted  . Pregnancy examination or test, positive result 02/10/2019  . Miscarriage 01/12/2019  . Positive pregnancy test 01/12/2019    History reviewed. No pertinent surgical history.   OB History    Gravida  2   Para  0   Term  0   Preterm  0   AB  1   Living  0     SAB  1   TAB  0   Ectopic  0   Multiple  0   Live Births  0           Family History  Problem Relation Age of Onset  . Hypertension Father   . Heart attack Paternal Grandfather     Social History   Tobacco Use  . Smoking status: Never Smoker  . Smokeless tobacco: Never Used  Substance Use Topics  . Alcohol use: No  . Drug use: No    Home Medications Prior to Admission medications   Medication Sig Start Date End Date Taking? Authorizing Provider  acetaminophen (TYLENOL) 500 MG tablet Take 500 mg by mouth every 6 (six) hours as needed.   Yes [provider]     Allergies    Patient has no known allergies.  Review of Systems   Review of Systems  Constitutional: Negative for appetite change and fever.  HENT: Positive for dental problem, ear pain and facial swelling. Negative for congestion, sore throat and trouble swallowing.   Eyes: Negative for pain and visual disturbance.  Gastrointestinal: Negative for abdominal pain, nausea and vomiting.  Musculoskeletal: Negative for neck pain and neck stiffness.  Skin: Negative for rash.  Neurological: Negative for dizziness, facial asymmetry and headaches.  Hematological: Negative for adenopathy.    Physical Exam Updated Vital Signs BP 128/80 (BP Location: Right Arm)   Pulse 87   Temp 99.2 F (37.3 C) (Oral)   Resp 14   Ht 5\' 8"  (1.727 m)   Wt 59 kg   LMP  (LMP Unknown)   SpO2 100%   BMI 19.77 kg/m   Physical Exam Vitals and nursing note reviewed.  Constitutional:      General: She is not in acute distress.    Appearance: Normal appearance. She is well-developed.  HENT:     Head: Normocephalic and atraumatic.     Jaw:  No trismus.     Right Ear: Tympanic membrane and ear canal normal.     Left Ear: Tympanic membrane and ear canal normal.     Mouth/Throat:     Mouth: Mucous membranes are moist. No oral lesions.     Dentition: Dental tenderness present. No gingival swelling, dental caries or dental abscesses.     Pharynx: Oropharynx is clear. Uvula midline. No oropharyngeal exudate, posterior oropharyngeal erythema or uvula swelling.     Comments: ttp of the left upper first molar.  There is a temoprary filling present.  No erythema or fluctuance of the surrounding gingiva. Uvula is midline and non-edematous.  No trismus.  Also, no appreciable facial edema noted.    Cardiovascular:     Rate and Rhythm: Normal rate and regular rhythm.     Pulses: Normal pulses.     Heart sounds: No murmur.  Pulmonary:     Effort: Pulmonary effort is normal.     Breath sounds: Normal breath sounds.   Musculoskeletal:        General: Normal range of motion.     Cervical back: Normal range of motion and neck supple.  Lymphadenopathy:     Cervical: No cervical adenopathy.  Skin:    General: Skin is warm.     Findings: No rash.  Neurological:     General: No focal deficit present.     Mental Status: She is alert.     Sensory: No sensory deficit.     Motor: No weakness or abnormal muscle tone.     ED Results / Procedures / Treatments   Labs (all labs ordered are listed, but only abnormal results are displayed) Labs Reviewed - No data to display  EKG None  Radiology No results found.  Procedures Procedures (including critical care time)  Medications Ordered in ED Medications - No data to display  ED Course  I have reviewed the triage vital signs and the nursing notes.  Pertinent labs & imaging results that were available during my care of the patient were reviewed by me and considered in my medical decision making (see chart for details).    MDM Rules/Calculators/A&P                      Pt with dental pain.  No trismus, dental abscess or significant facial swelling.  No concerning symptoms for Ludwig's angina.  She is [redacted] weeks pregnant and has routine prenatal care.  No pregnancy related complaints tonight.  She agrees to continue tylenol for pain, I will prescribe amoxil and close f/u with dentistry.     Final Clinical Impression(s) / ED Diagnoses Final diagnoses:  Pain, dental    Rx / DC Orders ED Discharge Orders    None       Bufford Lope 03/19/19 2042    Margette Fast, MD 03/20/19 1322

## 2019-03-19 NOTE — ED Triage Notes (Addendum)
Pt c/o left, upper dental pain that started last night. Pt states she took tylenol this AM that relieved the pain for a little while. Pt is currently [redacted] weeks pregnant.

## 2019-03-19 NOTE — Discharge Instructions (Addendum)
Continue taking Tylenol, 500 mg every 4 hours as needed for pain.  This is safe to take during your pregnancy, but you will need to avoid taking ibuprofen, aspirin, or Aleve.  Contact one of the dentists listed to arrange a follow-up appointment.

## 2019-03-21 ENCOUNTER — Encounter: Payer: Self-pay | Admitting: Certified Nurse Midwife

## 2019-03-23 ENCOUNTER — Encounter: Payer: Self-pay | Admitting: Certified Nurse Midwife

## 2019-04-11 ENCOUNTER — Other Ambulatory Visit: Payer: Self-pay | Admitting: Obstetrics and Gynecology

## 2019-04-11 DIAGNOSIS — Z3682 Encounter for antenatal screening for nuchal translucency: Secondary | ICD-10-CM

## 2019-04-12 ENCOUNTER — Ambulatory Visit (INDEPENDENT_AMBULATORY_CARE_PROVIDER_SITE_OTHER): Payer: 59 | Admitting: Women's Health

## 2019-04-12 ENCOUNTER — Encounter: Payer: Self-pay | Admitting: Women's Health

## 2019-04-12 ENCOUNTER — Ambulatory Visit (INDEPENDENT_AMBULATORY_CARE_PROVIDER_SITE_OTHER): Payer: 59

## 2019-04-12 ENCOUNTER — Ambulatory Visit: Payer: 59 | Admitting: *Deleted

## 2019-04-12 ENCOUNTER — Other Ambulatory Visit: Payer: Self-pay

## 2019-04-12 VITALS — BP 122/66 | HR 81 | Wt 129.0 lb

## 2019-04-12 DIAGNOSIS — Z3A12 12 weeks gestation of pregnancy: Secondary | ICD-10-CM

## 2019-04-12 DIAGNOSIS — Z3480 Encounter for supervision of other normal pregnancy, unspecified trimester: Secondary | ICD-10-CM | POA: Diagnosis not present

## 2019-04-12 DIAGNOSIS — Z3A13 13 weeks gestation of pregnancy: Secondary | ICD-10-CM | POA: Diagnosis not present

## 2019-04-12 DIAGNOSIS — Z3682 Encounter for antenatal screening for nuchal translucency: Secondary | ICD-10-CM | POA: Diagnosis not present

## 2019-04-12 DIAGNOSIS — Z3481 Encounter for supervision of other normal pregnancy, first trimester: Secondary | ICD-10-CM

## 2019-04-12 DIAGNOSIS — Z363 Encounter for antenatal screening for malformations: Secondary | ICD-10-CM

## 2019-04-12 DIAGNOSIS — Z31430 Encounter of female for testing for genetic disease carrier status for procreative management: Secondary | ICD-10-CM | POA: Diagnosis not present

## 2019-04-12 NOTE — Progress Notes (Signed)
INITIAL OBSTETRICAL VISIT Patient name: Emily [name] MRN [id number]  Date of birth: 02/22/1995 Chief Complaint:   Initial Prenatal Visit  History of Present Illness:   Emily [name] is a 24 y.o. G13P0010 African American female at [redacted]w[redacted]d by 5wk u/s, with an Estimated Date of Delivery: 10/20/19 being seen today for her initial obstetrical visit.   Her obstetrical history is significant for SAB x 1.   Today she reports low back pain into Rt buttocks.  Depression screen [facility] 2/9 04/12/2019 01/12/2019  Decreased Interest 0 0  Down, Depressed, Hopeless 0 0  PHQ - 2 Score 0 0  Altered sleeping 0 -  Tired, decreased energy 0 -  Change in appetite 0 -  Feeling bad or failure about yourself  0 -  Trouble concentrating 0 -  Moving slowly or fidgety/restless 0 -  Suicidal thoughts 0 -  PHQ-9 Score 0 -  Difficult doing work/chores Not difficult at all -  Some encounter information is confidential and restricted. Go to Review Flowsheets activity to see all data.    No LMP recorded (lmp unknown). Patient is pregnant. Last pap never, turned 23yo in Nov. Results were: n/a-wants pap next visit Review of Systems:   Pertinent items are noted in HPI Denies cramping/contractions, leakage of fluid, vaginal bleeding, abnormal vaginal discharge w/ itching/odor/irritation, headaches, visual changes, shortness of breath, chest pain, abdominal pain, severe nausea/vomiting, or problems with urination or bowel movements unless otherwise stated above.  Pertinent History Reviewed:  Reviewed past medical,surgical, social, obstetrical and family history.  Reviewed problem list, medications and allergies. OB History  Gravida Para Term Preterm AB Living  2 0 0 0 1 0  SAB TAB Ectopic Multiple Live Births  1 0 0 0 0    # Outcome Date GA Lbr Len/2nd Weight Sex Delivery Anes PTL Lv  2 Current           1 SAB 01/2019           Physical Assessment:   Vitals:   04/12/19 1045  BP: 122/66  Pulse: 81  Weight:  129 lb (58.5 kg)  Body mass index is 19.61 kg/m.       Physical Examination:  General appearance - well appearing, and in no distress  Mental status - alert, oriented to person, place, and time  Psych:  She has a normal mood and affect  Skin - warm and dry, normal color, no suspicious lesions noted  Chest - effort normal, all lung fields clear to auscultation bilaterally  Heart - normal rate and regular rhythm  Abdomen - soft, nontender  Extremities:  No swelling or varicosities noted  Thin prep pap is not done   TODAY'S NT [place] 12+5 wks,measurements 72.06 mm,fhr 152 bpm,normal ovaries,NB present,NT 1.2 mm,posterior placenta   No results found for this or any previous visit (from the past 24 hour(s)).  Assessment & Plan:  1) Low-Risk Pregnancy G2P0010 at [redacted]w[redacted]d with an Estimated Date of Delivery: 10/20/19   2) Initial OB visit  3) LBP/Sciatica> gave printed exercises  Meds: No orders of the defined types were placed in this encounter.   Initial labs obtained Continue prenatal vitamins Reviewed n/v relief measures and warning s/s to report Reviewed recommended weight gain based on pre-gravid BMI Encouraged well-balanced diet Genetic Screening discussed: requested nt/it, Panorama drawn by Tish, RN Carrier screening discussed requested Horizon 14 drawn by Tish, RN Ultrasound discussed; fetal survey: requested CCNC completed>PCM not here, form faxed The nature of  [place] for [facility] with multiple MDs and other Advanced Practice Providers was explained to patient; also emphasized that fellows, residents, and students are part of our team. Does not have home bp cuff. To buy one if desires to do online visits. Check bp weekly, let us know if >140/90.   Follow-up: Return in about 5 weeks (around 05/19/2019) for [place] w/ pap, 2nd IT, YK:DXIPJAS, in person, CNM.   Orders Placed This Encounter  Procedures  . Urine Culture  . GC/Chlamydia Probe Amp  . US OB Comp +  14 Wk  . Integrated 1  . Obstetric Panel, Including HIV  . Hepatitis C antibody  . POC Urinalysis Dipstick OB    Roma Schanz CNM, [facility] 04/12/2019 11:44 AM

## 2019-04-12 NOTE — Progress Notes (Signed)
[place] 12+5 wks,measurements 72.06 mm,fhr 152 bpm,normal ovaries,NB present,NT 1.2 mm,posterior placenta

## 2019-04-12 NOTE — Patient Instructions (Addendum)
Emily [name], I greatly value your feedback.  If you receive a survey following your visit with [place] today, we appreciate you taking the time to fill it out.  Thanks, Joellyn Haff, CNM, WHNP-BC   [facility] ([address] [place], [place] [address]) Entrance C, located off of [address] 24/7 valet parking   Nausea & Vomiting  Have saltine crackers or pretzels by your bed and eat a few bites before you raise your head out of bed in the morning  Eat small frequent meals throughout the day instead of large meals  Drink plenty of fluids throughout the day to stay hydrated, just don't drink a lot of fluids with your meals.  This can make your stomach fill up faster making you feel sick  Do not brush your teeth right after you eat  Products with real ginger are good for nausea, like ginger ale and ginger hard candy Make sure it says made with real ginger!  Sucking on sour candy like lemon heads is also good for nausea  If your prenatal vitamins make you nauseated, take them at night so you will sleep through the nausea  Sea Bands  If you feel like you need medicine for the nausea & vomiting please let us know  If you are unable to keep any fluids or food down please let us know   Constipation  Drink plenty of fluid, preferably water, throughout the day  Eat foods high in fiber such as fruits, vegetables, and grains  Exercise, such as walking, is a good way to keep your bowels regular  Drink warm fluids, especially warm prune juice, or decaf coffee  Eat a 1/2 cup of real oatmeal (not instant), 1/2 cup applesauce, and 1/2-1 cup warm prune juice every day  If needed, you may take Colace (docusate sodium) stool softener once or twice a day to help keep the stool soft.   If you still are having problems with constipation, you may take Miralax once daily as needed to help keep your bowels regular.   Home Blood Pressure Monitoring for Patients    Your provider has recommended that you check your blood pressure (BP) at least once a week at home. If you do not have a blood pressure cuff at home, one will be provided for you. Contact your provider if you have not received your monitor within 1 week.   Helpful Tips for Accurate Home Blood Pressure Checks  . Don't smoke, exercise, or drink caffeine 30 minutes before checking your BP . Use the restroom before checking your BP (a full bladder can raise your pressure) . Relax in a comfortable upright chair . Feet on the ground . Left arm resting comfortably on a flat surface at the level of your heart . Legs uncrossed . Back supported . Sit quietly and don't talk . Place the cuff on your bare arm . Adjust snuggly, so that only two fingertips can fit between your skin and the top of the cuff . Check 2 readings separated by at least one minute . Keep a log of your BP readings . For a visual, please reference this diagram: http://ccnc.care/bpdiagram  Provider Name: [facility]     Phone: [phone]  Zone 1: ALL CLEAR  Continue to monitor your symptoms:  . BP reading is less than 140 (top number) or less than 90 (bottom number)  . No right upper stomach pain . No headaches or  seeing spots . No feeling nauseated or throwing up . No swelling in [name] and hands  Zone 2: CAUTION Call your doctor's office for any of the following:  . BP reading is greater than 140 (top number) or greater than 90 (bottom number)  . Stomach pain under your ribs in the middle or right side . Headaches or seeing spots . Feeling nauseated or throwing up . Swelling in [name] and hands  Zone 3: EMERGENCY  Seek immediate medical care if you have any of the following:  . BP reading is greater than160 (top number) or greater than 110 (bottom number) . Severe headaches not improving with Tylenol . Serious difficulty catching your breath . Any worsening symptoms from Zone 2    First Trimester of  Pregnancy The first trimester of pregnancy is from week 1 until the end of week 12 (months 1 through 3). A week after a sperm fertilizes an egg, the egg will implant on the wall of the uterus. This embryo will begin to develop into a baby. Genes from you and your partner are forming the baby. The female genes determine whether the baby is a boy or a girl. At 6-8 weeks, the eyes and [name] are formed, and the heartbeat can be seen on ultrasound. At the end of 12 weeks, all the baby's organs are formed.  Now that you are pregnant, you will want to do everything you can to have a healthy baby. Two of the most important things are to get good prenatal care and to follow your health care provider's instructions. Prenatal care is all the medical care you receive before the baby's birth. This care will help prevent, find, and treat any problems during the pregnancy and childbirth. BODY CHANGES Your body goes through many changes during pregnancy. The changes vary from woman to woman.   You may gain or lose a couple of pounds at first.  You may feel sick to your stomach (nauseous) and throw up (vomit). If the vomiting is uncontrollable, call your health care provider.  You may tire easily.  You may develop headaches that can be relieved by medicines approved by your health care provider.  You may urinate more often. Painful urination may mean you have a bladder infection.  You may develop heartburn as a result of your pregnancy.  You may develop constipation because certain hormones are causing the muscles that push waste through your intestines to slow down.  You may develop hemorrhoids or swollen, bulging veins (varicose veins).  Your breasts may begin to grow larger and become tender. Your nipples may stick out more, and the tissue that surrounds them (areola) may become darker.  Your gums may bleed and may be sensitive to brushing and flossing.  Dark spots or blotches (chloasma, mask of pregnancy)  may develop on your [name]. This will likely fade after the baby is born.  Your menstrual periods will stop.  You may have a loss of appetite.  You may develop cravings for certain kinds of food.  You may have changes in your emotions from day to day, such as being excited to be pregnant or being concerned that something may go wrong with the pregnancy and baby.  You may have more vivid and strange dreams.  You may have changes in your hair. These can include thickening of your hair, rapid growth, and changes in texture. Some women also have hair loss during or after pregnancy, or hair that feels dry or thin. Your  hair will most likely return to normal after your baby is born. WHAT TO EXPECT AT YOUR PRENATAL VISITS During a routine prenatal visit:  You will be weighed to make sure you and the baby are growing normally.  Your blood pressure will be taken.  Your abdomen will be measured to track your baby's growth.  The fetal heartbeat will be listened to starting around week 10 or 12 of your pregnancy.  Test results from any previous visits will be discussed. Your health care provider may ask you:  How you are feeling.  If you are feeling the baby move.  If you have had any abnormal symptoms, such as leaking fluid, bleeding, severe headaches, or abdominal cramping.  If you have any questions. Other tests that may be performed during your first trimester include:  Blood tests to find your blood type and to check for the presence of any previous infections. They will also be used to check for low iron levels (anemia) and Rh antibodies. Later in the pregnancy, blood tests for diabetes will be done along with other tests if problems develop.  Urine tests to check for infections, diabetes, or protein in the urine.  An ultrasound to confirm the proper growth and development of the baby.  An amniocentesis to check for possible genetic problems.  Fetal screens for spina bifida and  Down syndrome.  You may need other tests to make sure you and the baby are doing well. HOME CARE INSTRUCTIONS  Medicines  Follow your health care provider's instructions regarding medicine use. Specific medicines may be either safe or unsafe to take during pregnancy.  Take your prenatal vitamins as directed.  If you develop constipation, try taking a stool softener if your health care provider approves. Diet  Eat regular, well-balanced meals. Choose a variety of foods, such as meat or vegetable-based protein, fish, milk and low-fat dairy products, vegetables, fruits, and whole grain breads and cereals. Your health care provider will help you determine the amount of weight gain that is right for you.  Avoid raw meat and uncooked cheese. These carry germs that can cause birth defects in the baby.  Eating four or five small meals rather than three large meals a day may help relieve nausea and vomiting. If you start to feel nauseous, eating a few soda crackers can be helpful. Drinking liquids between meals instead of during meals also seems to help nausea and vomiting.  If you develop constipation, eat more high-fiber foods, such as fresh vegetables or fruit and whole grains. Drink enough fluids to keep your urine clear or pale yellow. Activity and Exercise  Exercise only as directed by your health care provider. Exercising will help you:  Control your weight.  Stay in shape.  Be prepared for labor and delivery.  Experiencing pain or cramping in the lower abdomen or low back is a good sign that you should stop exercising. Check with your health care provider before continuing normal exercises.  Try to avoid standing for long periods of time. Move your legs often if you must stand in one place for a long time.  Avoid heavy lifting.  Wear low-heeled shoes, and practice good posture.  You may continue to have sex unless your health care provider directs you otherwise. Relief of Pain  or Discomfort  Wear a good support bra for breast tenderness.    Take warm sitz baths to soothe any pain or discomfort caused by hemorrhoids. Use hemorrhoid cream if your health care provider  approves.    Rest with your legs elevated if you have leg cramps or low back pain.  If you develop varicose veins in your legs, wear support hose. Elevate your feet for 15 minutes, 3-4 times a day. Limit salt in your diet. Prenatal Care  Schedule your prenatal visits by the twelfth week of pregnancy. They are usually scheduled monthly at first, then more often in the last 2 months before delivery.  Write down your questions. Take them to your prenatal visits.  Keep all your prenatal visits as directed by your health care provider. Safety  Wear your seat belt at all times when driving.  Make a list of emergency phone numbers, including numbers for family, friends, the hospital, and police and fire departments. General Tips  Ask your health care provider for a referral to a local prenatal education class. Begin classes no later than at the beginning of month 6 of your pregnancy.  Ask for help if you have counseling or nutritional needs during pregnancy. Your health care provider can offer advice or refer you to specialists for help with various needs.  Do not use hot tubs, steam rooms, or saunas.  Do not douche or use tampons or scented sanitary pads.  Do not cross your legs for long periods of time.  Avoid cat litter boxes and soil used by cats. These carry germs that can cause birth defects in the baby and possibly loss of the fetus by miscarriage or stillbirth.  Avoid all smoking, herbs, alcohol, and medicines not prescribed by your health care provider. Chemicals in these affect the formation and growth of the baby.  Schedule a dentist appointment. At home, brush your teeth with a soft toothbrush and be gentle when you floss. SEEK MEDICAL CARE IF:   You have dizziness.  You have mild  pelvic cramps, pelvic pressure, or nagging pain in the abdominal area.  You have persistent nausea, vomiting, or diarrhea.  You have a bad smelling vaginal discharge.  You have pain with urination.  You notice increased swelling in your [name], hands, legs, or ankles. SEEK IMMEDIATE MEDICAL CARE IF:   You have a fever.  You are leaking fluid from your vagina.  You have spotting or bleeding from your vagina.  You have severe abdominal cramping or pain.  You have rapid weight gain or loss.  You vomit blood or material that looks like coffee grounds.  You are exposed to [place] measles and have never had them.  You are exposed to fifth disease or chickenpox.  You develop a severe headache.  You have shortness of breath.  You have any kind of trauma, such as from a fall or a car accident. Document Released: 12/17/2000 Document Revised: 05/09/2013 Document Reviewed: 11/02/2012 [facility] Patient Information 2015 [place], [place]. This information is not intended to replace advice given to you by your health care provider. Make sure you discuss any questions you have with your health care provider.    Sciatica Rehab Ask your health care provider which exercises are safe for you. Do exercises exactly as told by your health care provider and adjust them as directed. It is normal to feel mild stretching, pulling, tightness, or discomfort as you do these exercises. Stop right away if you feel sudden pain or your pain gets worse. Do not begin these exercises until told by your health care provider. Stretching and range-of-motion exercises These exercises warm up your muscles and joints and improve the movement and flexibility of your hips  and back. These exercises also help to relieve pain, numbness, and tingling. Sciatic nerve glide 1. Sit in a chair with your head facing down toward your chest. Place your hands behind your back. Let your shoulders slump forward. 2. Slowly straighten one of  your legs while you tilt your head back as if you are looking toward the ceiling. Only straighten your leg as far as you can without making your symptoms worse. 3. Hold this position for __________ seconds. 4. Slowly return to the starting position. 5. Repeat with your other leg. Repeat __________ times. Complete this exercise __________ times a day. Knee to chest with hip adduction and internal rotation  1. Lie on your back on a firm surface with both legs straight. 2. Bend one of your knees and move it up toward your chest until you feel a gentle stretch in your lower back and buttock. Then, move your knee toward the shoulder that is on the opposite side from your leg. This is hip adduction and internal rotation. ? Hold your leg in this position by holding on to the front of your knee. 3. Hold this position for __________ seconds. 4. Slowly return to the starting position. 5. Repeat with your other leg. Repeat __________ times. Complete this exercise __________ times a day. Prone extension on elbows  1. Lie on your abdomen on a firm surface. A bed may be too soft for this exercise. 2. Prop yourself up on your elbows. 3. Use your arms to help lift your chest up until you feel a gentle stretch in your abdomen and your lower back. ? This will place some of your body weight on your elbows. If this is uncomfortable, try stacking pillows under your chest. ? Your hips should stay down, against the surface that you are lying on. Keep your hip and back muscles relaxed. 4. Hold this position for __________ seconds. 5. Slowly relax your upper body and return to the starting position. Repeat __________ times. Complete this exercise __________ times a day. Strengthening exercises These exercises build strength and endurance in your back. Endurance is the ability to use your muscles for a long time, even after they get tired. Pelvic tilt This exercise strengthens the muscles that lie deep in the  abdomen. 1. Lie on your back on a firm surface. Bend your knees and keep your feet flat on the floor. 2. Tense your abdominal muscles. Tip your pelvis up toward the ceiling and flatten your lower back into the floor. ? To help with this exercise, you may place a small towel under your lower back and try to push your back into the towel. 3. Hold this position for __________ seconds. 4. Let your muscles relax completely before you repeat this exercise. Repeat __________ times. Complete this exercise __________ times a day. Alternating arm and leg raises  1. Get on your hands and knees on a firm surface. If you are on a hard floor, you may want to use padding, such as an exercise mat, to cushion your knees. 2. Line up your arms and legs. Your hands should be directly below your shoulders, and your knees should be directly below your hips. 3. Lift your left leg behind you. At the same time, raise your right arm and straighten it in front of you. ? Do not lift your leg higher than your hip. ? Do not lift your arm higher than your shoulder. ? Keep your abdominal and back muscles tight. ? Keep your hips facing  the ground. ? Do not arch your back. ? Keep your balance carefully, and do not hold your breath. 4. Hold this position for __________ seconds. 5. Slowly return to the starting position. 6. Repeat with your right leg and your left arm. Repeat __________ times. Complete this exercise __________ times a day. Posture and body mechanics Good posture and healthy body mechanics can help to relieve stress in your body's tissues and joints. Body mechanics refers to the movements and positions of your body while you do your daily activities. Posture is part of body mechanics. Good posture means:  Your spine is in its natural S-curve position (neutral).  Your shoulders are pulled back slightly.  Your head is not tipped forward. Follow these guidelines to improve your posture and body mechanics in  your everyday activities. Standing   When standing, keep your spine neutral and your feet about hip width apart. Keep a slight bend in your knees. Your ears, shoulders, and hips should line up.  When you do a task in which you stand in one place for a long time, place one foot up on a stable object that is 2-4 inches (5-10 cm) high, such as a footstool. This helps keep your spine neutral. Sitting   When sitting, keep your spine neutral and keep your feet flat on the floor. Use a footrest, if necessary, and keep your thighs parallel to the floor. Avoid rounding your shoulders, and avoid tilting your head forward.  When working at a desk or a computer, keep your desk at a height where your hands are slightly lower than your elbows. Slide your chair under your desk so you are close enough to maintain good posture.  When working at a computer, place your monitor at a height where you are looking straight ahead and you do not have to tilt your head forward or downward to look at the screen. Resting  When lying down and resting, avoid positions that are most painful for you.  If you have pain with activities such as sitting, bending, stooping, or squatting, lie in a position in which your body does not bend very much. For example, avoid curling up on your side with your arms and knees near your chest (fetal position).  If you have pain with activities such as standing for a long time or reaching with your arms, lie with your spine in a neutral position and bend your knees slightly. Try the following positions: ? Lying on your side with a pillow between your knees. ? Lying on your back with a pillow under your knees. Lifting   When lifting objects, keep your feet at least shoulder width apart and tighten your abdominal muscles.  Bend your knees and hips and keep your spine neutral. It is important to lift using the strength of your legs, not your back. Do not lock your knees straight  out.  Always ask for help to lift heavy or awkward objects. This information is not intended to replace advice given to you by your health care provider. Make sure you discuss any questions you have with your health care provider. Document Revised: 04/16/2018 Document Reviewed: 01/14/2018 Elsevier Patient Education  2020 [employer].

## 2019-04-13 LAB — GC/CHLAMYDIA PROBE AMP
Chlamydia trachomatis, NAA: NEGATIVE
Neisseria Gonorrhoeae by PCR: NEGATIVE

## 2019-04-14 LAB — INTEGRATED 1
Crown Rump Length: 72.1 mm
Gest. Age on Collection Date: 13.1 weeks
Maternal Age at EDD: 21.9 yr
Nuchal Translucency (NT): 1.2 mm
Number of Fetuses: 1
PAPP-A Value: 1785.7 ng/mL
Weight: 129 [lb_av]

## 2019-04-14 LAB — OBSTETRIC PANEL, INCLUDING HIV
Antibody Screen: NEGATIVE
Basophils Absolute: 0 10*3/uL (ref 0.0–0.2)
Basos: 1 %
EOS (ABSOLUTE): 0.2 10*3/uL (ref 0.0–0.4)
Eos: 3 %
HIV Screen 4th Generation wRfx: NONREACTIVE
Hematocrit: 39.1 % (ref 34.0–46.6)
Hemoglobin: 12.5 g/dL (ref 11.1–15.9)
Hepatitis B Surface Ag: NEGATIVE
Immature Grans (Abs): 0 10*3/uL (ref 0.0–0.1)
Immature Granulocytes: 0 %
Lymphocytes Absolute: 1.4 10*3/uL (ref 0.7–3.1)
Lymphs: 21 %
MCH: 23.3 pg — ABNORMAL LOW (ref 26.6–33.0)
MCHC: 32 g/dL (ref 31.5–35.7)
MCV: 73 fL — ABNORMAL LOW (ref 79–97)
Monocytes Absolute: 0.5 10*3/uL (ref 0.1–0.9)
Monocytes: 7 %
Neutrophils Absolute: 4.5 10*3/uL (ref 1.4–7.0)
Neutrophils: 68 %
Platelets: 265 10*3/uL (ref 150–450)
RBC: 5.36 x10E6/uL — ABNORMAL HIGH (ref 3.77–5.28)
RDW: 14.5 % (ref 11.7–15.4)
RPR Ser Ql: NONREACTIVE
Rh Factor: POSITIVE
Rubella Antibodies, IGG: 5.21 index (ref >0.99)
WBC: 6.7 10*3/uL (ref 3.4–10.8)

## 2019-04-14 LAB — HEPATITIS C ANTIBODY: Hep C Virus Ab: <0.1 s/co ratio (ref 0.0–0.9)

## 2019-04-14 LAB — URINE CULTURE: Organism ID, Bacteria: NO GROWTH

## 2019-04-19 ENCOUNTER — Other Ambulatory Visit: Payer: Self-pay | Admitting: *Deleted

## 2019-04-19 DIAGNOSIS — Z3480 Encounter for supervision of other normal pregnancy, unspecified trimester: Secondary | ICD-10-CM

## 2019-04-21 ENCOUNTER — Encounter: Payer: Self-pay | Admitting: Women's Health

## 2019-04-26 ENCOUNTER — Telehealth: Payer: Self-pay | Admitting: Women's Health

## 2019-04-26 NOTE — Telephone Encounter (Signed)
LMOVM for pt to return call to discuss genetic testing results.  Cheral Marker, CNM, [facility] 04/26/2019 1:44 PM

## 2019-05-06 ENCOUNTER — Telehealth: Payer: Self-pay | Admitting: Women's Health

## 2019-05-06 DIAGNOSIS — Z3480 Encounter for supervision of other normal pregnancy, unspecified trimester: Secondary | ICD-10-CM

## 2019-05-06 DIAGNOSIS — D563 Thalassemia minor: Secondary | ICD-10-CM

## 2019-05-06 NOTE — Telephone Encounter (Signed)
Called pt, discussed +alpha-thalassemia result on [employer] carrier screening- can cause mild anemia in pt- right now her hgb is 12.5. To continue pnv daily, will recheck 26-28wks. Discussed it is recommended FOB be tested to see if he is carrier, if both are carriers there is a possibility baby could have HgbH would could cause more profound anemia. Pt wants FOB tested. Notified Tish who will contact pt on Monday to get this set up.  Cheral Marker, CNM, [facility] 05/06/2019 2:14 PM

## 2019-05-17 ENCOUNTER — Encounter: Payer: Self-pay | Admitting: Women's Health

## 2019-05-17 ENCOUNTER — Ambulatory Visit (INDEPENDENT_AMBULATORY_CARE_PROVIDER_SITE_OTHER): Payer: 59

## 2019-05-17 ENCOUNTER — Ambulatory Visit (INDEPENDENT_AMBULATORY_CARE_PROVIDER_SITE_OTHER): Payer: Medicaid Other | Admitting: Women's Health

## 2019-05-17 ENCOUNTER — Other Ambulatory Visit (HOSPITAL_COMMUNITY)
Admission: RE | Admit: 2019-05-17 | Discharge: 2019-05-17 | Disposition: A | Discharge status: Home / Self Care | Payer: 59 | Source: Ambulatory Visit | Attending: Obstetrics and Gynecology | Admitting: Obstetrics and Gynecology

## 2019-05-17 VITALS — BP 114/58 | HR 79 | Wt 136.4 lb

## 2019-05-17 DIAGNOSIS — Z3480 Encounter for supervision of other normal pregnancy, unspecified trimester: Secondary | ICD-10-CM

## 2019-05-17 DIAGNOSIS — Z363 Encounter for antenatal screening for malformations: Secondary | ICD-10-CM

## 2019-05-17 DIAGNOSIS — Z01419 Encounter for gynecological examination (general) (routine) without abnormal findings: Secondary | ICD-10-CM

## 2019-05-17 DIAGNOSIS — Z3481 Encounter for supervision of other normal pregnancy, first trimester: Secondary | ICD-10-CM

## 2019-05-17 DIAGNOSIS — Z331 Pregnant state, incidental: Secondary | ICD-10-CM

## 2019-05-17 DIAGNOSIS — O288 Other abnormal findings on antenatal screening of mother: Secondary | ICD-10-CM

## 2019-05-17 DIAGNOSIS — Z1379 Encounter for other screening for genetic and chromosomal anomalies: Secondary | ICD-10-CM

## 2019-05-17 DIAGNOSIS — Z3A17 17 weeks gestation of pregnancy: Secondary | ICD-10-CM | POA: Diagnosis not present

## 2019-05-17 DIAGNOSIS — Z3482 Encounter for supervision of other normal pregnancy, second trimester: Secondary | ICD-10-CM

## 2019-05-17 DIAGNOSIS — Z1389 Encounter for screening for other disorder: Secondary | ICD-10-CM

## 2019-05-17 DIAGNOSIS — D563 Thalassemia minor: Secondary | ICD-10-CM

## 2019-05-17 LAB — POCT URINALYSIS DIPSTICK OB
Blood, UA: NEGATIVE
Glucose, UA: NEGATIVE
Ketones, UA: NEGATIVE
Leukocytes, UA: NEGATIVE
Nitrite, UA: NEGATIVE
POC,PROTEIN,UA: NEGATIVE

## 2019-05-17 NOTE — Progress Notes (Signed)
[place] 17+5 wks,breech,posterior placenta gr 0,cx 2.9 cm,svp of fluid 4.6 cm,fhr 150 bpm,efw 236 g 81%,anatomy complete,no obvious abnormalities

## 2019-05-17 NOTE — Progress Notes (Signed)
LOW-RISK PREGNANCY VISIT Patient name: Emily [name] MRN [id number]  Date of birth: [birth year] Chief Complaint:   Routine Prenatal Visit (Ultrasound)  History of Present Illness:   Emily [name] is a 24 y.o. G48P0010 female at 58w5dwith an Estimated Date of Delivery: 10/20/19 being seen today for ongoing management of a low-risk pregnancy.  [facility] 04/12/2019 01/12/2019  Decreased Interest 0 0  Down, Depressed, Hopeless 0 0  PHQ - 2 Score 0 0  Altered sleeping 0 -  Tired, decreased energy 0 -  Change in appetite 0 -  Feeling bad or failure about yourself  0 -  Trouble concentrating 0 -  Moving slowly or fidgety/restless 0 -  Suicidal thoughts 0 -  PHQ-9 Score 0 -  Difficult doing work/chores Not difficult at all -  Some encounter information is confidential and restricted. Go to Review Flowsheets activity to see all data.    Today she reports no complaints. Pt tested + for alpha thalassemia trait, FOB is getting kit shipped to house and someone coming to draw his bloodwork. Contractions: Not present. Vag. Bleeding: None.  Movement: Present. denies leaking of fluid. Review of Systems:   Pertinent items are noted in HPI Denies abnormal vaginal discharge w/ itching/odor/irritation, headaches, visual changes, shortness of breath, chest pain, abdominal pain, severe nausea/vomiting, or problems with urination or bowel movements unless otherwise stated above. Pertinent History Reviewed:  Reviewed past medical,surgical, social, obstetrical and family history.  Reviewed problem list, medications and allergies. Physical Assessment:   Vitals:   05/17/19 0954  BP: (!) 114/58  Pulse: 79  Weight: 136 lb 6.4 oz (61.9 kg)  Body mass index is 20.74 kg/m.        Physical Examination:   General appearance: Well appearing, and in no distress  Mental status: Alert, oriented to person, place, and time  Skin: Warm & dry  Cardiovascular: Normal heart rate noted  Respiratory:  Normal respiratory effort, no distress  Abdomen: Soft, gravid, nontender  Pelvic: thin prep pap obtained         Extremities: Edema: None  Fetal Status:     Movement: Present   [place]17+5 wks,breech,posterior placenta gr 0,cx 2.9 cm,svp of fluid 4.6 cm,fhr 150 bpm,efw 236 g 81%,anatomy complete,no obvious abnormalities    Chaperone: [place]   Results for orders placed or performed in visit on 05/17/19 (from the past 24 hour(s))  POC Urinalysis Dipstick OB   Collection Time: 05/17/19  9:52 AM  Result Value Ref Range   Color, UA     Clarity, UA     Glucose, UA Negative Negative   Bilirubin, UA     Ketones, UA n    Spec Grav, UA     Blood, UA n    pH, UA     POC,PROTEIN,UA Negative Negative, Trace, Small (1+), Moderate (2+), Large (3+), 4+   Urobilinogen, UA     Nitrite, UA n    Leukocytes, UA Negative Negative   Appearance     Odor      Assessment & Plan:  1) Low-risk pregnancy G2P0010 at 146w5dith an Estimated Date of Delivery: 10/20/19   2) +alpha thalassemia trait carrier, FOB getting tested   Meds: No orders of the defined types were placed in this encounter.  Labs/procedures today: 2nd IT, anatomy, pap  Plan:  Continue routine obstetrical care  Next visit: prefers in person    Reviewed: Preterm labor symptoms and general obstetric precautions including  but not limited to vaginal bleeding, contractions, leaking of fluid and fetal movement were reviewed in detail with the patient.  All questions were answered. Has home bp cuff.  Check bp weekly, let us know if >140/90.  Recommended baby ASA daily d/t primip/AA  Follow-up: Return in about 4 weeks (around 06/14/2019) for [place], [place], in person.  Orders Placed This Encounter  Procedures  . INTEGRATED 2  . POC Urinalysis Dipstick OB   Roma Schanz CNM, [facility] 05/17/2019 10:12 AM

## 2019-05-17 NOTE — Patient Instructions (Addendum)
Emily [name], I greatly value your feedback.  If you receive a survey following your visit with [place] today, we appreciate you taking the time to fill it out.  Thanks, Emily [name], CNM, WHNP-BC  [facility] [place] at [facility] ([place], [place] [address]) Entrance C, located off of [address]   Go to [email or web address] to register for FREE online childbirth classes Begin taking a 81mg  baby aspirin daily to decrease the risk of preeclampsia during pregnancy   [place] Pediatricians/Family Doctors:  [place] [phone]                 [place] [phone] (usually not accepting new patients unless you have family there already, you are always welcome to call and ask)       [facility] Department [phone]       [facility] Pediatricians/Family Doctors:   [facility]: [phone]  [facility]: [phone]  [facility]: [place] Doctors:   [facility]: [facility]: [place]:  [place]: [phone]    Home Blood Pressure Monitoring for Patients   Your provider has recommended that you check your blood pressure (BP) at least once a week at home. If you do not have a blood pressure cuff at home, one will be provided for you. Contact your provider if you have not received your monitor within 1 week.   Helpful Tips for Accurate Home Blood Pressure Checks  . Don't smoke, exercise, or drink caffeine 30 minutes before checking your BP . Use the restroom before checking your BP (a full bladder can raise your pressure) . Relax in a comfortable upright chair . Feet on the ground . Left arm resting comfortably on a flat surface at the level of your heart . Legs uncrossed . Back  supported . Sit quietly and don't talk . Place the cuff on your bare arm . Adjust snuggly, so that only two fingertips can fit between your skin and the top of the cuff . Check 2 readings separated by at least one minute . Keep a log of your BP readings . For a visual, please reference this diagram: http://ccnc.care/bpdiagram  Provider Name: [facility]     Phone: [phone]  Zone 1: ALL CLEAR  Continue to monitor your symptoms:  . BP reading is less than 140 (top number) or less than 90 (bottom number)  . No right upper stomach pain . No headaches or seeing spots . No feeling nauseated or throwing up . No swelling in face and hands  Zone 2: CAUTION Call your doctor's office for any of the following:  . BP reading is greater than 140 (top number) or greater than 90 (bottom number)  . Stomach pain under your ribs in the middle or right side . Headaches or seeing spots . Feeling nauseated or throwing up . Swelling in face and hands  Zone 3: EMERGENCY  Seek immediate medical care if you have any of the following:  . BP reading is greater than160 (top number) or greater than 110 (bottom number) . Severe headaches not improving with Tylenol . Serious difficulty catching your breath . Any worsening symptoms from Zone 2     Second Trimester of Pregnancy The second trimester is from week 14 through  week 27 (months 4 through 6). The second trimester is often a time when you feel your best. Your body has adjusted to being pregnant, and you begin to feel better physically. Usually, morning sickness has lessened or quit completely, you may have more energy, and you may have an increase in appetite. The second trimester is also a time when the fetus is growing rapidly. At the end of the sixth month, the fetus is about 9 inches long and weighs about 1 pounds. You will likely begin to feel the baby move (quickening) between 16 and 20 weeks of pregnancy. Body changes during your second  trimester Your body continues to go through many changes during your second trimester. The changes vary from woman to woman.  Your weight will continue to increase. You will notice your lower abdomen bulging out.  You may begin to get stretch marks on your hips, abdomen, and breasts.  You may develop headaches that can be relieved by medicines. The medicines should be approved by your health care provider.  You may urinate more often because the fetus is pressing on your bladder.  You may develop or continue to have heartburn as a result of your pregnancy.  You may develop constipation because certain hormones are causing the muscles that push waste through your intestines to slow down.  You may develop hemorrhoids or swollen, bulging veins (varicose veins).  You may have back pain. This is caused by: ? Weight gain. ? Pregnancy hormones that are relaxing the joints in your pelvis. ? A shift in weight and the muscles that support your balance.  Your breasts will continue to grow and they will continue to become tender.  Your gums may bleed and may be sensitive to brushing and flossing.  Dark spots or blotches (chloasma, mask of pregnancy) may develop on your face. This will likely fade after the baby is born.  A dark line from your belly button to the pubic area (linea nigra) may appear. This will likely fade after the baby is born.  You may have changes in your hair. These can include thickening of your hair, rapid growth, and changes in texture. Some women also have hair loss during or after pregnancy, or hair that feels dry or thin. Your hair will most likely return to normal after your baby is born.  What to expect at prenatal visits During a routine prenatal visit:  You will be weighed to make sure you and the fetus are growing normally.  Your blood pressure will be taken.  Your abdomen will be measured to track your baby's growth.  The fetal heartbeat will be listened  to.  Any test results from the previous visit will be discussed.  Your health care provider may ask you:  How you are feeling.  If you are feeling the baby move.  If you have had any abnormal symptoms, such as leaking fluid, bleeding, severe headaches, or abdominal cramping.  If you are using any tobacco products, including cigarettes, chewing tobacco, and electronic cigarettes.  If you have any questions.  Other tests that may be performed during your second trimester include:  Blood tests that check for: ? Low iron levels (anemia). ? High blood sugar that affects pregnant women (gestational diabetes) between 60 and 28 weeks. ? Rh antibodies. This is to check for a protein on red blood cells (Rh factor).  Urine tests to check for infections, diabetes, or protein in the urine.  An ultrasound to confirm the proper  growth and development of the baby.  An amniocentesis to check for possible genetic problems.  Fetal screens for spina bifida and Down syndrome.  HIV (human immunodeficiency virus) testing. Routine prenatal testing includes screening for HIV, unless you choose not to have this test.  Follow these instructions at home: Medicines  Follow your health care provider's instructions regarding medicine use. Specific medicines may be either safe or unsafe to take during pregnancy.  Take a prenatal vitamin that contains at least 600 micrograms (mcg) of folic acid.  If you develop constipation, try taking a stool softener if your health care provider approves. Eating and drinking  Eat a balanced diet that includes fresh fruits and vegetables, whole grains, good sources of protein such as meat, eggs, or tofu, and low-fat dairy. Your health care provider will help you determine the amount of weight gain that is right for you.  Avoid raw meat and uncooked cheese. These carry germs that can cause birth defects in the baby.  If you have low calcium intake from food, talk to  your health care provider about whether you should take a daily calcium supplement.  Limit foods that are high in fat and processed sugars, such as fried and sweet foods.  To prevent constipation: ? Drink enough fluid to keep your urine clear or pale yellow. ? Eat foods that are high in fiber, such as fresh fruits and vegetables, whole grains, and beans. Activity  Exercise only as directed by your health care provider. Most women can continue their usual exercise routine during pregnancy. Try to exercise for 30 minutes at least 5 days a week. Stop exercising if you experience uterine contractions.  Avoid heavy lifting, wear low heel shoes, and practice good posture.  A sexual relationship may be continued unless your health care provider directs you otherwise. Relieving pain and discomfort  Wear a good support bra to prevent discomfort from breast tenderness.  Take warm sitz baths to soothe any pain or discomfort caused by hemorrhoids. Use hemorrhoid cream if your health care provider approves.  Rest with your legs elevated if you have leg cramps or low back pain.  If you develop varicose veins, wear support hose. Elevate your feet for 15 minutes, 3-4 times a day. Limit salt in your diet. Prenatal Care  Write down your questions. Take them to your prenatal visits.  Keep all your prenatal visits as told by your health care provider. This is important. Safety  Wear your seat belt at all times when driving.  Make a list of emergency phone numbers, including numbers for family, friends, the hospital, and police and fire departments. General instructions  Ask your health care provider for a referral to a local prenatal education class. Begin classes no later than the beginning of month 6 of your pregnancy.  Ask for help if you have counseling or nutritional needs during pregnancy. Your health care provider can offer advice or refer you to specialists for help with various needs.  Do  not use hot tubs, steam rooms, or saunas.  Do not douche or use tampons or scented sanitary pads.  Do not cross your legs for long periods of time.  Avoid cat litter boxes and soil used by cats. These carry germs that can cause birth defects in the baby and possibly loss of the fetus by miscarriage or stillbirth.  Avoid all smoking, herbs, alcohol, and unprescribed drugs. Chemicals in these products can affect the formation and growth of the baby.  Do not  use any products that contain nicotine or tobacco, such as cigarettes and e-cigarettes. If you need help quitting, ask your health care provider.  Visit your dentist if you have not gone yet during your pregnancy. Use a soft toothbrush to brush your teeth and be gentle when you floss. Contact a health care provider if:  You have dizziness.  You have mild pelvic cramps, pelvic pressure, or nagging pain in the abdominal area.  You have persistent nausea, vomiting, or diarrhea.  You have a bad smelling vaginal discharge.  You have pain when you urinate. Get help right away if:  You have a fever.  You are leaking fluid from your vagina.  You have spotting or bleeding from your vagina.  You have severe abdominal cramping or pain.  You have rapid weight gain or weight loss.  You have shortness of breath with chest pain.  You notice sudden or extreme swelling of your face, hands, ankles, feet, or legs.  You have not felt your baby move in over an hour.  You have severe headaches that do not go away when you take medicine.  You have vision changes. Summary  The second trimester is from week 14 through week 27 (months 4 through 6). It is also a time when the fetus is growing rapidly.  Your body goes through many changes during pregnancy. The changes vary from woman to woman.  Avoid all smoking, herbs, alcohol, and unprescribed drugs. These chemicals affect the formation and growth your baby.  Do not use any tobacco  products, such as cigarettes, chewing tobacco, and e-cigarettes. If you need help quitting, ask your health care provider.  Contact your health care provider if you have any questions. Keep all prenatal visits as told by your health care provider. This is important. This information is not intended to replace advice given to you by your health care provider. Make sure you discuss any questions you have with your health care provider. Document Released: 12/17/2000 Document Revised: 05/31/2015 Document Reviewed: 02/24/2012 Elsevier Interactive Patient Education  2017 Else[employer] Inc.  PROTECT YOURSELF & YOUR BABY FROM THE FLU! Because you are pregnant, we at [facility], along with the Centers for Disease Control (CDC), recommend that you receive the flu vaccine to protect yourself and your baby from the flu. The flu is more likely to cause severe illness in pregnant women than in women of reproductive age who are not pregnant. Changes in the immune system, heart, and lungs during pregnancy make pregnant women (and women up to two weeks postpartum) more prone to severe illness from flu, including illness resulting in hospitalization. Flu also may be harmful for a pregnant woman's developing baby. A common flu symptom is fever, which may be associated with neural tube defects and other adverse outcomes for a developing baby. Getting vaccinated can also help protect a baby after birth from flu. (Mom passes antibodies onto the developing baby during her pregnancy.)  A Flu Vaccine is the Best Protection Against Flu Getting a flu vaccine is the first and most important step in protecting against flu. Pregnant women should get a flu shot and not the live attenuated influenza vaccine (LAIV), also known as nasal spray flu vaccine. Flu vaccines given during pregnancy help protect both the mother and her baby from flu. Vaccination has been shown to reduce the risk of flu-associated acute respiratory infection in  pregnant women by up to one-half. A 2018 study showed that getting a flu shot reduced a pregnant woman's  risk of being hospitalized with flu by an average of 40 percent. Pregnant women who get a flu vaccine are also helping to protect their babies from flu illness for the first several months after their birth, when they are too young to get vaccinated.   A Long Record of Safety for Flu Shots in Pregnant Women Flu shots have been given to millions of pregnant women over many years with a good safety record. There is a lot of evidence that flu vaccines can be given safely during pregnancy; though these data are limited for the first trimester. The CDC recommends that pregnant women get vaccinated during any trimester of their pregnancy. It is very important for pregnant women to get the flu shot.   Other Preventive Actions In addition to getting a flu shot, pregnant women should take the same everyday preventive actions the CDC recommends of everyone, including covering coughs, washing hands often, and avoiding people who are sick.  Symptoms and Treatment If you get sick with flu symptoms call your doctor right away. There are antiviral drugs that can treat flu illness and prevent serious flu complications. The CDC recommends prompt treatment for people who have influenza infection or suspected influenza infection and who are at high risk of serious flu complications, such as people with asthma, diabetes (including gestational diabetes), or heart disease. Early treatment of influenza in hospitalized pregnant women has been shown to reduce the length of the hospital stay.  Symptoms Flu symptoms include fever, cough, sore throat, runny or stuffy nose, body aches, headache, chills and fatigue. Some people may also have vomiting and diarrhea. People may be infected with the flu and have respiratory symptoms without a fever.  Early Treatment is Important for Pregnant Women Treatment should begin as soon as  possible because antiviral drugs work best when started early (within 48 hours after symptoms start). Antiviral drugs can make your flu illness milder and make you feel better faster. They may also prevent serious health problems that can result from flu illness. Oral oseltamivir (Tamiflu) is the preferred treatment for pregnant women because it has the most studies available to suggest that it is safe and beneficial. Antiviral drugs require a prescription from your provider. Having a fever caused by flu infection or other infections early in pregnancy may be linked to birth defects in a baby. In addition to taking antiviral drugs, pregnant women who get a fever should treat their fever with Tylenol (acetaminophen) and contact their provider immediately.  When to Seek Emergency Medical Care If you are pregnant and have any of these signs, seek care immediately:  Difficulty breathing or shortness of breath  Pain or pressure in the chest or abdomen  Sudden dizziness  Confusion  Severe or persistent vomiting  High fever that is not responding to Tylenol (or store brand equivalent)  Decreased or no movement of your baby  [employer].pt.htm

## 2019-05-18 LAB — CYTOLOGY - PAP
Adequacy: ABSENT
Diagnosis: NEGATIVE

## 2019-05-19 LAB — INTEGRATED 2
AFP MoM: 1.08
Alpha-Fetoprotein: 56.1 ng/mL
Crown Rump Length: 72.1 mm
DIA MoM: 1.06
DIA Value: 182.8 pg/mL
Estriol, Unconjugated: 1.16 ng/mL
Gest. Age on Collection Date: 13.1 weeks
Gestational Age: 18.1 weeks
Maternal Age at EDD: 21.9 yr
Nuchal Translucency (NT): 1.2 mm
Nuchal Translucency MoM: 0.73
Number of Fetuses: 1
PAPP-A MoM: 1.18
PAPP-A Value: 1785.7 ng/mL
Test Results:: NEGATIVE
Weight: 129 [lb_av]
Weight: 129 [lb_av]
hCG MoM: 1.69
hCG Value: 45.1 IU/mL
uE3 MoM: 0.76

## 2019-05-25 ENCOUNTER — Telehealth: Payer: Self-pay | Admitting: Women's Health

## 2019-05-25 NOTE — Telephone Encounter (Signed)
Telephoned patient at home number and patient states is constipated. Patient states has no tried anything for the constipation. Advised patient could try Colace, Miralax, Dulcolax suppositories, increase fiber intake, and water intake. Patient states did not go to work today and would like note. Advised would send to provider. Patient voiced understanding.

## 2019-05-25 NOTE — Telephone Encounter (Signed)
Patient's mom called stating that her daughter has been constipated for aq couple of days now, they tried some home remedies to get her to go. She has gone a little bit but patient still has the urge to go and nothing comes out. Pt's mom would like to know if there is something we could prescribe her daughter so she could use the restroom.

## 2019-05-26 ENCOUNTER — Encounter: Payer: Self-pay | Admitting: *Deleted

## 2019-05-30 ENCOUNTER — Encounter: Payer: Self-pay | Admitting: *Deleted

## 2019-05-30 NOTE — Telephone Encounter (Signed)
Telephoned patient at home number and left message to return call.  

## 2019-05-31 ENCOUNTER — Telehealth: Payer: Self-pay | Admitting: *Deleted

## 2019-05-31 ENCOUNTER — Encounter: Payer: Self-pay | Admitting: *Deleted

## 2019-05-31 NOTE — Telephone Encounter (Signed)
Telephoned patient at home number and advised work note was ready for pick up. Patient voiced understanding,

## 2019-05-31 NOTE — Telephone Encounter (Signed)
Pt left a message on the machine that she was returning you call.

## 2019-06-14 ENCOUNTER — Ambulatory Visit (INDEPENDENT_AMBULATORY_CARE_PROVIDER_SITE_OTHER): Payer: 59 | Admitting: Women's Health

## 2019-06-14 ENCOUNTER — Encounter: Payer: Self-pay | Admitting: Women's Health

## 2019-06-14 VITALS — BP 122/68 | HR 88 | Wt 146.0 lb

## 2019-06-14 DIAGNOSIS — Z1389 Encounter for screening for other disorder: Secondary | ICD-10-CM

## 2019-06-14 DIAGNOSIS — Z3482 Encounter for supervision of other normal pregnancy, second trimester: Secondary | ICD-10-CM

## 2019-06-14 DIAGNOSIS — Z331 Pregnant state, incidental: Secondary | ICD-10-CM

## 2019-06-14 DIAGNOSIS — D560 Alpha thalassemia: Secondary | ICD-10-CM

## 2019-06-14 DIAGNOSIS — Z3480 Encounter for supervision of other normal pregnancy, unspecified trimester: Secondary | ICD-10-CM

## 2019-06-14 DIAGNOSIS — Z3A21 21 weeks gestation of pregnancy: Secondary | ICD-10-CM

## 2019-06-14 LAB — POCT URINALYSIS DIPSTICK OB
Blood, UA: NEGATIVE
Glucose, UA: NEGATIVE
Ketones, UA: NEGATIVE
Leukocytes, UA: NEGATIVE
Nitrite, UA: NEGATIVE
POC,PROTEIN,UA: NEGATIVE

## 2019-06-14 NOTE — Progress Notes (Signed)
LOW-RISK PREGNANCY VISIT Patient name: Emily [name] MRN [id number]  Date of birth: 25-Jul-1995 Chief Complaint:   Routine Prenatal Visit  History of Present Illness:   Emily [name] is a 24 y.o. G90P0010 female at [redacted]w[redacted]d with an Estimated Date of Delivery: 10/20/19 being seen today for ongoing management of a low-risk pregnancy.  Depression screen [facility] 2/9 04/12/2019 01/12/2019  Decreased Interest 0 0  Down, Depressed, Hopeless 0 0  PHQ - 2 Score 0 0  Altered sleeping 0 -  Tired, decreased energy 0 -  Change in appetite 0 -  Feeling bad or failure about yourself  0 -  Trouble concentrating 0 -  Moving slowly or fidgety/restless 0 -  Suicidal thoughts 0 -  PHQ-9 Score 0 -  Difficult doing work/chores Not difficult at all -  Some encounter information is confidential and restricted. Go to Review Flowsheets activity to see all data.    Today she reports no complaints. Plans to go to [place] and [place] in July. Contractions: Not present. Vag. Bleeding: None.  Movement: Present. denies leaking of fluid. Review of Systems:   Pertinent items are noted in HPI Denies abnormal vaginal discharge w/ itching/odor/irritation, headaches, visual changes, shortness of breath, chest pain, abdominal pain, severe nausea/vomiting, or problems with urination or bowel movements unless otherwise stated above. Pertinent History Reviewed:  Reviewed past medical,surgical, social, obstetrical and family history.  Reviewed problem list, medications and allergies. Physical Assessment:   Vitals:   06/14/19 0912  BP: 122/68  Pulse: 88  Weight: 146 lb (66.2 kg)  Body mass index is 22.2 kg/m.        Physical Examination:   General appearance: Well appearing, and in no distress  Mental status: Alert, oriented to person, place, and time  Skin: Warm & dry  Cardiovascular: Normal heart rate noted  Respiratory: Normal respiratory effort, no distress  Abdomen: Soft, gravid, nontender  Pelvic: Cervical exam  deferred         Extremities: Edema: None  Fetal Status: Fetal Heart Rate (bpm): 153 Fundal Height: 21 cm Movement: Present    Chaperone: n/a    Results for orders placed or performed in visit on 06/14/19 (from the past 24 hour(s))  POC Urinalysis Dipstick OB   Collection Time: 06/14/19  9:19 AM  Result Value Ref Range   Color, UA     Clarity, UA     Glucose, UA Negative Negative   Bilirubin, UA     Ketones, UA neg    Spec Grav, UA     Blood, UA neg    pH, UA     POC,PROTEIN,UA Negative Negative, Trace, Small (1+), Moderate (2+), Large (3+), 4+   Urobilinogen, UA     Nitrite, UA neg    Leukocytes, UA Negative Negative   Appearance     Odor      Assessment & Plan:  1) Low-risk pregnancy G2P0010 at [redacted]w[redacted]d with an Estimated Date of Delivery: 10/20/19   2) Alpha-thalassemia carrier, FOB still planning to get tested, had to go to work the day lab was scheduled to come to him to draw, pt plans to call [employer] back to reschedule   Meds: No orders of the defined types were placed in this encounter.  Labs/procedures today: none  Plan:  Continue routine obstetrical care  Next visit: prefers will be in person for pn2    Reviewed: Preterm labor symptoms and general obstetric precautions including but not limited to vaginal bleeding, contractions, leaking of  fluid and fetal movement were reviewed in detail with the patient.  All questions were answered. Has home bp cuff.  Check bp weekly, let us know if >140/90.   Follow-up: Return in 1 month (on 07/14/2019) for LROB, PN2, in person, CNM.  Orders Placed This Encounter  Procedures  . POC Urinalysis Dipstick OB   Roma Schanz CNM, [facility] 06/14/2019 9:30 AM

## 2019-06-14 NOTE — Patient Instructions (Signed)
Emily [name], I greatly value your feedback.  If you receive a survey following your visit with [place] today, we appreciate you taking the time to fill it out.  Thanks, Joellyn Haff, CNM, WHNP-BC   You will have your sugar test next visit.  Please do not eat or drink anything after midnight the night before you come, not even water.  You will be here for at least two hours.  Please make an appointment online for the bloodwork at [email or web address] for 8:30am (or as close to this as possible). Make sure you select the [facility] service center. The day of the appointment, check in with our office first, then you will go to [facility] to start the sugar test.    [facility] [place], [place] [address]) Entrance C, located off of [address] valet parking  Go to [email or web address] to register for FREE online childbirth classes   Call the office [phone]) or go to [facility] if:  You begin to have strong, frequent contractions  Your water breaks.  Sometimes it is a big gush of fluid, sometimes it is just a trickle that keeps getting your panties wet or running down your legs  You have vaginal bleeding.  It is normal to have a small amount of spotting if your cervix was checked.   You don't feel your baby moving like normal.  If you don't, get you something to eat and drink and lay down and focus on feeling your baby move.   If your baby is still not moving like normal, you should call the office or go to [facility].  [place] Pediatricians/Family Doctors:  [facility] [phone]            [facility] [phone]                 [facility] [phone] (usually not accepting new patients unless you have family there already, you are always welcome to call and ask)       [facility] Department [phone]       [facility] Pediatricians/Family Doctors:   [facility]:  [phone]  [facility]: [phone]  [facility]: [phone]  [facility] Doctors:   [facility]: [phone]   [facility]: [phone]  [facility] Doctors:  [facility]: [phone]   Home Blood Pressure Monitoring for Patients   Your provider has recommended that you check your blood pressure (BP) at least once a week at home. If you do not have a blood pressure cuff at home, one will be provided for you. Contact your provider if you have not received your monitor within 1 week.   Helpful Tips for Accurate Home Blood Pressure Checks  . Don't smoke, exercise, or drink caffeine 30 minutes before checking your BP . Use the restroom before checking your BP (a full bladder can raise your pressure) . Relax in a comfortable upright chair . Feet on the ground . Left arm resting comfortably on a flat surface at the level of your heart . Legs uncrossed . Back supported . Sit quietly and don't talk . Place the cuff on your bare arm . Adjust snuggly, so that only two fingertips can fit between your skin and the top of the cuff . Check 2 readings separated by at least one minute . Keep a log of your BP readings . For a visual, please  reference this diagram: http://ccnc.care/bpdiagram  Provider Name: [facility]     Phone: [phone]  Zone 1: ALL CLEAR  Continue to monitor your symptoms:  . BP reading is less than 140 (top number) or less than 90 (bottom number)  . No right upper stomach pain . No headaches or seeing spots . No feeling nauseated or throwing up . No swelling in [name] and hands  Zone 2: CAUTION Call your doctor's office for any of the following:  . BP reading is greater than 140 (top number) or greater than 90 (bottom number)  . Stomach pain under your ribs in the middle or right side . Headaches or seeing spots . Feeling nauseated or throwing up . Swelling in  [name] and hands  Zone 3: EMERGENCY  Seek immediate medical care if you have any of the following:  . BP reading is greater than160 (top number) or greater than 110 (bottom number) . Severe headaches not improving with Tylenol . Serious difficulty catching your breath . Any worsening symptoms from Zone 2   Second Trimester of Pregnancy The second trimester is from week 13 through week 28, months 4 through 6. The second trimester is often a time when you feel your best. Your body has also adjusted to being pregnant, and you begin to feel better physically. Usually, morning sickness has lessened or quit completely, you may have more energy, and you may have an increase in appetite. The second trimester is also a time when the fetus is growing rapidly. At the end of the sixth month, the fetus is about 9 inches long and weighs about 1 pounds. You will likely begin to feel the baby move (quickening) between 18 and 20 weeks of the pregnancy. BODY CHANGES Your body goes through many changes during pregnancy. The changes vary from woman to woman.   Your weight will continue to increase. You will notice your lower abdomen bulging out.  You may begin to get stretch marks on your hips, abdomen, and breasts.  You may develop headaches that can be relieved by medicines approved by your health care provider.  You may urinate more often because the fetus is pressing on your bladder.  You may develop or continue to have heartburn as a result of your pregnancy.  You may develop constipation because certain hormones are causing the muscles that push waste through your intestines to slow down.  You may develop hemorrhoids or swollen, bulging veins (varicose veins).  You may have back pain because of the weight gain and pregnancy hormones relaxing your joints between the bones in your pelvis and as a result of a shift in weight and the muscles that support your balance.  Your breasts will continue to grow  and be tender.  Your gums may bleed and may be sensitive to brushing and flossing.  Dark spots or blotches (chloasma, mask of pregnancy) may develop on your [name]. This will likely fade after the baby is born.  A dark line from your belly button to the pubic area (linea nigra) may appear. This will likely fade after the baby is born.  You may have changes in your hair. These can include thickening of your hair, rapid growth, and changes in texture. Some women also have hair loss during or after pregnancy, or hair that feels dry or thin. Your hair will most likely return to normal after your baby is born. WHAT TO EXPECT AT YOUR PRENATAL VISITS During a routine prenatal visit:  You  will be weighed to make sure you and the fetus are growing normally.  Your blood pressure will be taken.  Your abdomen will be measured to track your baby's growth.  The fetal heartbeat will be listened to.  Any test results from the previous visit will be discussed. Your health care provider may ask you:  How you are feeling.  If you are feeling the baby move.  If you have had any abnormal symptoms, such as leaking fluid, bleeding, severe headaches, or abdominal cramping.  If you have any questions. Other tests that may be performed during your second trimester include:  Blood tests that check for:  Low iron levels (anemia).  Gestational diabetes (between 24 and 28 weeks).  Rh antibodies.  Urine tests to check for infections, diabetes, or protein in the urine.  An ultrasound to confirm the proper growth and development of the baby.  An amniocentesis to check for possible genetic problems.  Fetal screens for spina bifida and Down syndrome. HOME CARE INSTRUCTIONS   Avoid all smoking, herbs, alcohol, and unprescribed drugs. These chemicals affect the formation and growth of the baby.  Follow your health care provider's instructions regarding medicine use. There are medicines that are either  safe or unsafe to take during pregnancy.  Exercise only as directed by your health care provider. Experiencing uterine cramps is a good sign to stop exercising.  Continue to eat regular, healthy meals.  Wear a good support bra for breast tenderness.  Do not use hot tubs, steam rooms, or saunas.  Wear your seat belt at all times when driving.  Avoid raw meat, uncooked cheese, cat litter boxes, and soil used by cats. These carry germs that can cause birth defects in the baby.  Take your prenatal vitamins.  Try taking a stool softener (if your health care provider approves) if you develop constipation. Eat more high-fiber foods, such as fresh vegetables or fruit and whole grains. Drink plenty of fluids to keep your urine clear or pale yellow.  Take warm sitz baths to soothe any pain or discomfort caused by hemorrhoids. Use hemorrhoid cream if your health care provider approves.  If you develop varicose veins, wear support hose. Elevate your feet for 15 minutes, 3-4 times a day. Limit salt in your diet.  Avoid heavy lifting, wear low heel shoes, and practice good posture.  Rest with your legs elevated if you have leg cramps or low back pain.  Visit your dentist if you have not gone yet during your pregnancy. Use a soft toothbrush to brush your teeth and be gentle when you floss.  A sexual relationship may be continued unless your health care provider directs you otherwise.  Continue to go to all your prenatal visits as directed by your health care provider. SEEK MEDICAL CARE IF:   You have dizziness.  You have mild pelvic cramps, pelvic pressure, or nagging pain in the abdominal area.  You have persistent nausea, vomiting, or diarrhea.  You have a bad smelling vaginal discharge.  You have pain with urination. SEEK IMMEDIATE MEDICAL CARE IF:   You have a fever.  You are leaking fluid from your vagina.  You have spotting or bleeding from your vagina.  You have severe  abdominal cramping or pain.  You have rapid weight gain or loss.  You have shortness of breath with chest pain.  You notice sudden or extreme swelling of your [name], hands, ankles, feet, or legs.  You have not felt your baby move  in over an hour.  You have severe headaches that do not go away with medicine.  You have vision changes. Document Released: 12/17/2000 Document Revised: 12/28/2012 Document Reviewed: 02/24/2012 [facility] Patient Information 2015 [place], [place]. This information is not intended to replace advice given to you by your health care provider. Make sure you discuss any questions you have with your health care provider.

## 2019-06-27 ENCOUNTER — Ambulatory Visit: Payer: 59 | Admitting: Certified Nurse Midwife

## 2019-07-14 ENCOUNTER — Ambulatory Visit (INDEPENDENT_AMBULATORY_CARE_PROVIDER_SITE_OTHER): Payer: 59 | Admitting: Women's Health

## 2019-07-14 ENCOUNTER — Other Ambulatory Visit: Payer: 59

## 2019-07-14 ENCOUNTER — Encounter: Payer: Self-pay | Admitting: Women's Health

## 2019-07-14 VITALS — BP 116/59 | HR 82 | Wt 154.0 lb

## 2019-07-14 DIAGNOSIS — Z3480 Encounter for supervision of other normal pregnancy, unspecified trimester: Secondary | ICD-10-CM | POA: Diagnosis not present

## 2019-07-14 DIAGNOSIS — Z3A26 26 weeks gestation of pregnancy: Secondary | ICD-10-CM

## 2019-07-14 DIAGNOSIS — Z331 Pregnant state, incidental: Secondary | ICD-10-CM

## 2019-07-14 DIAGNOSIS — Z131 Encounter for screening for diabetes mellitus: Secondary | ICD-10-CM

## 2019-07-14 DIAGNOSIS — D563 Thalassemia minor: Secondary | ICD-10-CM

## 2019-07-14 DIAGNOSIS — Z23 Encounter for immunization: Secondary | ICD-10-CM | POA: Diagnosis not present

## 2019-07-14 DIAGNOSIS — Z3482 Encounter for supervision of other normal pregnancy, second trimester: Secondary | ICD-10-CM

## 2019-07-14 DIAGNOSIS — Z1389 Encounter for screening for other disorder: Secondary | ICD-10-CM

## 2019-07-14 LAB — POCT URINALYSIS DIPSTICK OB
Blood, UA: NEGATIVE
Glucose, UA: NEGATIVE
Ketones, UA: NEGATIVE
Leukocytes, UA: NEGATIVE
Nitrite, UA: NEGATIVE
POC,PROTEIN,UA: NEGATIVE

## 2019-07-14 NOTE — Patient Instructions (Signed)
Emily [name], I greatly value your feedback.  If you receive a survey following your visit with [place] today, we appreciate you taking the time to fill it out.  Thanks, Joellyn Haff, CNM, WHNP-BC   [facility] ([address] [place], [place] [address]) Entrance C, located off of [address] valet parking  Go to [email or web address] to register for FREE online childbirth classes   Call the office [phone]) or go to [facility] if:  You begin to have strong, frequent contractions  Your water breaks.  Sometimes it is a big gush of fluid, sometimes it is just a trickle that keeps getting your panties wet or running down your legs  You have vaginal bleeding.  It is normal to have a small amount of spotting if your cervix was checked.   You don't feel your baby moving like normal.  If you don't, get you something to eat and drink and lay down and focus on feeling your baby move.  You should feel at least 10 movements in 2 hours.  If you don't, you should call the office or go to [facility].    Tdap Vaccine  It is recommended that you get the Tdap vaccine during the third trimester of EACH pregnancy to help protect your baby from getting pertussis (whooping cough)  27-36 weeks is the BEST time to do this so that you can pass the protection on to your baby. During pregnancy is better than after pregnancy, but if you are unable to get it during pregnancy it will be offered at the hospital.   You can get this vaccine with [place], at the health department, your family doctor, or some local pharmacies  Everyone who will be around your baby should also be up-to-date on their vaccines before the baby comes. Adults (who are not pregnant) only need 1 dose of Tdap during adulthood.   [place] Pediatricians/Family Doctors:  [facility] [phone]            [facility] [phone]                 [facility]  [phone] (usually not accepting new patients unless you have family there already, you are always welcome to call and ask)       [facility] Department [phone]       [facility] Pediatricians/Family Doctors:   [facility]: [phone]  [facility]: [phone]  [facility]: [phone]  [facility] Doctors:   [facility]: [phone]   [facility]: [phone]  [facility] Doctors:  [facility]: [phone]   Home Blood Pressure Monitoring for Patients   Your provider has recommended that you check your blood pressure (BP) at least once a week at home. If you do not have a blood pressure cuff at home, one will be provided for you. Contact your provider if you have not received your monitor within 1 week.   Helpful Tips for Accurate Home Blood Pressure Checks  . Don't smoke, exercise, or drink caffeine 30 minutes before checking your BP . Use the restroom before checking your BP (a full bladder can raise your pressure) . Relax in a comfortable upright chair . Feet on the ground . Left arm resting comfortably on a flat surface at the level of your heart . Legs uncrossed . Back supported . Sit quietly and don't talk . Place the cuff on your  bare arm . Adjust snuggly, so that only two fingertips can fit between your skin and the top of the cuff . Check 2 readings separated by at least one minute . Keep a log of your BP readings . For a visual, please reference this diagram: http://ccnc.care/bpdiagram  Provider Name: [facility]     Phone: [phone]  Zone 1: ALL CLEAR  Continue to monitor your symptoms:  . BP reading is less than 140 (top number) or less than 90 (bottom number)  . No right upper stomach pain . No headaches or seeing spots . No feeling nauseated or throwing up . No swelling in [name] and hands  Zone 2: CAUTION Call your  doctor's office for any of the following:  . BP reading is greater than 140 (top number) or greater than 90 (bottom number)  . Stomach pain under your ribs in the middle or right side . Headaches or seeing spots . Feeling nauseated or throwing up . Swelling in [name] and hands  Zone 3: EMERGENCY  Seek immediate medical care if you have any of the following:  . BP reading is greater than160 (top number) or greater than 110 (bottom number) . Severe headaches not improving with Tylenol . Serious difficulty catching your breath . Any worsening symptoms from Zone 2   Third Trimester of Pregnancy The third trimester is from week 29 through week 42, months 7 through 9. The third trimester is a time when the fetus is growing rapidly. At the end of the ninth month, the fetus is about 20 inches in length and weighs 6-10 pounds.  BODY CHANGES Your body goes through many changes during pregnancy. The changes vary from woman to woman.   Your weight will continue to increase. You can expect to gain 25-35 pounds (11-16 kg) by the end of the pregnancy.  You may begin to get stretch marks on your hips, abdomen, and breasts.  You may urinate more often because the fetus is moving lower into your pelvis and pressing on your bladder.  You may develop or continue to have heartburn as a result of your pregnancy.  You may develop constipation because certain hormones are causing the muscles that push waste through your intestines to slow down.  You may develop hemorrhoids or swollen, bulging veins (varicose veins).  You may have pelvic pain because of the weight gain and pregnancy hormones relaxing your joints between the bones in your pelvis. Backaches may result from overexertion of the muscles supporting your posture.  You may have changes in your hair. These can include thickening of your hair, rapid growth, and changes in texture. Some women also have hair loss during or after pregnancy, or hair that  feels dry or thin. Your hair will most likely return to normal after your baby is born.  Your breasts will continue to grow and be tender. A yellow discharge may leak from your breasts called colostrum.  Your belly button may stick out.  You may feel short of breath because of your expanding uterus.  You may notice the fetus "dropping," or moving lower in your abdomen.  You may have a bloody mucus discharge. This usually occurs a few days to a week before labor begins.  Your cervix becomes thin and soft (effaced) near your due date. WHAT TO EXPECT AT YOUR PRENATAL EXAMS  You will have prenatal exams every 2 weeks until week 36. Then, you will have weekly prenatal exams. During a routine prenatal visit:  You will be weighed to make sure you and the fetus are growing normally.  Your blood pressure is taken.  Your abdomen will be measured to track your baby's growth.  The fetal heartbeat will be listened to.  Any test results from the previous visit will be discussed.  You may have a cervical check near your due date to see if you have effaced. At around 36 weeks, your caregiver will check your cervix. At the same time, your caregiver will also perform a test on the secretions of the vaginal tissue. This test is to determine if a type of bacteria, Group B streptococcus, is present. Your caregiver will explain this further. Your caregiver may ask you:  What your birth plan is.  How you are feeling.  If you are feeling the baby move.  If you have had any abnormal symptoms, such as leaking fluid, bleeding, severe headaches, or abdominal cramping.  If you have any questions. Other tests or screenings that may be performed during your third trimester include:  Blood tests that check for low iron levels (anemia).  Fetal testing to check the health, activity level, and growth of the fetus. Testing is done if you have certain medical conditions or if there are problems during the  pregnancy. FALSE LABOR You may feel small, irregular contractions that eventually go away. These are called Braxton Hicks contractions, or false labor. Contractions may last for hours, days, or even weeks before true labor sets in. If contractions come at regular intervals, intensify, or become painful, it is best to be seen by your caregiver.  SIGNS OF LABOR   Menstrual-like cramps.  Contractions that are 5 minutes apart or less.  Contractions that start on the top of the uterus and spread down to the lower abdomen and back.  A sense of increased pelvic pressure or back pain.  A watery or bloody mucus discharge that comes from the vagina. If you have any of these signs before the 37th week of pregnancy, call your caregiver right away. You need to go to the hospital to get checked immediately. HOME CARE INSTRUCTIONS   Avoid all smoking, herbs, alcohol, and unprescribed drugs. These chemicals affect the formation and growth of the baby.  Follow your caregiver's instructions regarding medicine use. There are medicines that are either safe or unsafe to take during pregnancy.  Exercise only as directed by your caregiver. Experiencing uterine cramps is a good sign to stop exercising.  Continue to eat regular, healthy meals.  Wear a good support bra for breast tenderness.  Do not use hot tubs, steam rooms, or saunas.  Wear your seat belt at all times when driving.  Avoid raw meat, uncooked cheese, cat litter boxes, and soil used by cats. These carry germs that can cause birth defects in the baby.  Take your prenatal vitamins.  Try taking a stool softener (if your caregiver approves) if you develop constipation. Eat more high-fiber foods, such as fresh vegetables or fruit and whole grains. Drink plenty of fluids to keep your urine clear or pale yellow.  Take warm sitz baths to soothe any pain or discomfort caused by hemorrhoids. Use hemorrhoid cream if your caregiver approves.  If you  develop varicose veins, wear support hose. Elevate your feet for 15 minutes, 3-4 times a day. Limit salt in your diet.  Avoid heavy lifting, wear low heal shoes, and practice good posture.  Rest a lot with your legs elevated if you have leg cramps or low  back pain.  Visit your dentist if you have not gone during your pregnancy. Use a soft toothbrush to brush your teeth and be gentle when you floss.  A sexual relationship may be continued unless your caregiver directs you otherwise.  Do not travel far distances unless it is absolutely necessary and only with the approval of your caregiver.  Take prenatal classes to understand, practice, and ask questions about the labor and delivery.  Make a trial run to the hospital.  Pack your hospital bag.  Prepare the baby's nursery.  Continue to go to all your prenatal visits as directed by your caregiver. SEEK MEDICAL CARE IF:  You are unsure if you are in labor or if your water has broken.  You have dizziness.  You have mild pelvic cramps, pelvic pressure, or nagging pain in your abdominal area.  You have persistent nausea, vomiting, or diarrhea.  You have a bad smelling vaginal discharge.  You have pain with urination. SEEK IMMEDIATE MEDICAL CARE IF:   You have a fever.  You are leaking fluid from your vagina.  You have spotting or bleeding from your vagina.  You have severe abdominal cramping or pain.  You have rapid weight loss or gain.  You have shortness of breath with chest pain.  You notice sudden or extreme swelling of your [name], hands, ankles, feet, or legs.  You have not felt your baby move in over an hour.  You have severe headaches that do not go away with medicine.  You have vision changes. Document Released: 12/17/2000 Document Revised: 12/28/2012 Document Reviewed: 02/24/2012 [facility] Patient Information 2015 [place], [place]. This information is not intended to replace advice given to you by your health  care provider. Make sure you discuss any questions you have with your health care provider.

## 2019-07-14 NOTE — Progress Notes (Signed)
LOW-RISK PREGNANCY VISIT Patient name: Emily [name] MRN [id number]  Date of birth: [birth year] Chief Complaint:   Routine Prenatal Visit (PN2)  History of Present Illness:   ROSE-MARIE HICKLING is a 24 y.o. G60P0010 female at [redacted]w[redacted]d with an Estimated Date of Delivery: 10/20/19 being seen today for ongoing management of a low-risk pregnancy.  Depression screen [facility] 2/9 07/14/2019 04/12/2019 01/12/2019  Decreased Interest 0 0 0  Down, Depressed, Hopeless 0 0 0  PHQ - 2 Score 0 0 0  Altered sleeping 0 0 -  Tired, decreased energy 1 0 -  Change in appetite 0 0 -  Feeling bad or failure about yourself  0 0 -  Trouble concentrating 0 0 -  Moving slowly or fidgety/restless 0 0 -  Suicidal thoughts 0 0 -  PHQ-9 Score 1 0 -  Difficult doing work/chores - Not difficult at all -  Some encounter information is confidential and restricted. Go to Review Flowsheets activity to see all data.    Today she reports no complaints. Contractions: Not present. Vag. Bleeding: None.  Movement: Present. denies leaking of fluid. Review of Systems:   Pertinent items are noted in HPI Denies abnormal vaginal discharge w/ itching/odor/irritation, headaches, visual changes, shortness of breath, chest pain, abdominal pain, severe nausea/vomiting, or problems with urination or bowel movements unless otherwise stated above. Pertinent History Reviewed:  Reviewed past medical,surgical, social, obstetrical and family history.  Reviewed problem list, medications and allergies. Physical Assessment:   Vitals:   07/14/19 0913  BP: (!) 116/59  Pulse: 82  Weight: 154 lb (69.9 kg)  Body mass index is 23.42 kg/m.        Physical Examination:   General appearance: Well appearing, and in no distress  Mental status: Alert, oriented to person, place, and time  Skin: Warm & dry  Cardiovascular: Normal heart rate noted  Respiratory: Normal respiratory effort, no distress  Abdomen: Soft, gravid, nontender  Pelvic: Cervical exam  deferred         Extremities: Edema: None  Fetal Status: Fetal Heart Rate (bpm): 137 Fundal Height: 26 cm Movement: Present    Chaperone: n/a    Results for orders placed or performed in visit on 07/14/19 (from the past 24 hour(s))  POC Urinalysis Dipstick OB   Collection Time: 07/14/19  9:15 AM  Result Value Ref Range   Color, UA     Clarity, UA     Glucose, UA Negative Negative   Bilirubin, UA     Ketones, UA neg    Spec Grav, UA     Blood, UA neg    pH, UA     POC,PROTEIN,UA Negative Negative, Trace, Small (1+), Moderate (2+), Large (3+), 4+   Urobilinogen, UA     Nitrite, UA neg    Leukocytes, UA Negative Negative   Appearance     Odor      Assessment & Plan:  1) Low-risk pregnancy G2P0010 at [redacted]w[redacted]d with an Estimated Date of Delivery: 10/20/19   2) +alpha thalassemia carrier, FOB still wants to get tested, but hasn't worked out w/ his work schedule- Tish to help her work on getting him tested   Meds: No orders of the defined types were placed in this encounter.  Labs/procedures today: pn2, tdap  Plan:  Continue routine obstetrical care  Next visit: prefers in person    Reviewed: Preterm labor symptoms and general obstetric precautions including but not limited to vaginal bleeding, contractions, leaking of fluid and fetal  movement were reviewed in detail with the patient.  All questions were answered. Has home bp cuff.  Check bp weekly, let us know if >140/90.   Follow-up: Return in about 4 weeks (around 08/11/2019) for LROB, CNM, in person.  Orders Placed This Encounter  Procedures   POC Urinalysis Dipstick OB   Cheral Marker CNM, [facility] 07/14/2019 9:39 AM

## 2019-07-14 NOTE — Addendum Note (Signed)
Addended by: Moss Mc on: 07/14/2019 10:55 AM   Modules accepted: Orders

## 2019-07-15 ENCOUNTER — Other Ambulatory Visit: Payer: Self-pay | Admitting: Women's Health

## 2019-07-15 LAB — CBC
Hematocrit: 32 % — ABNORMAL LOW (ref 34.0–46.6)
Hemoglobin: 10.4 g/dL — ABNORMAL LOW (ref 11.1–15.9)
MCH: 24.1 pg — ABNORMAL LOW (ref 26.6–33.0)
MCHC: 32.5 g/dL (ref 31.5–35.7)
MCV: 74 fL — ABNORMAL LOW (ref 79–97)
Platelets: 210 10*3/uL (ref 150–450)
RBC: 4.32 x10E6/uL (ref 3.77–5.28)
RDW: 13.7 % (ref 11.7–15.4)
WBC: 8.7 10*3/uL (ref 3.4–10.8)

## 2019-07-15 LAB — GLUCOSE TOLERANCE, 2 HOURS W/ 1HR
Glucose, 1 hour: 121 mg/dL (ref 65–179)
Glucose, 2 hour: 97 mg/dL (ref 65–152)
Glucose, Fasting: 78 mg/dL (ref 65–91)

## 2019-07-15 LAB — HIV ANTIBODY (ROUTINE TESTING W REFLEX): HIV Screen 4th Generation wRfx: NONREACTIVE

## 2019-07-15 LAB — RPR: RPR Ser Ql: NONREACTIVE

## 2019-07-15 LAB — ANTIBODY SCREEN: Antibody Screen: NEGATIVE

## 2019-07-15 MED ORDER — FERROUS SULFATE 325 (65 FE) MG PO TABS: 325.0000 mg | ORAL_TABLET | Freq: Two times a day (BID) | ORAL | 3 refills | Status: DC

## 2019-07-26 ENCOUNTER — Encounter: Payer: Self-pay | Admitting: *Deleted

## 2019-07-26 DIAGNOSIS — Z3480 Encounter for supervision of other normal pregnancy, unspecified trimester: Secondary | ICD-10-CM

## 2019-08-11 ENCOUNTER — Encounter: Payer: Self-pay | Admitting: Advanced Practice Midwife

## 2019-08-11 ENCOUNTER — Ambulatory Visit (INDEPENDENT_AMBULATORY_CARE_PROVIDER_SITE_OTHER): Payer: 59 | Admitting: Advanced Practice Midwife

## 2019-08-11 ENCOUNTER — Other Ambulatory Visit: Payer: Self-pay

## 2019-08-11 VITALS — BP 124/78 | HR 97 | Wt 157.5 lb

## 2019-08-11 DIAGNOSIS — Z3A30 30 weeks gestation of pregnancy: Secondary | ICD-10-CM | POA: Diagnosis not present

## 2019-08-11 DIAGNOSIS — Z3480 Encounter for supervision of other normal pregnancy, unspecified trimester: Secondary | ICD-10-CM

## 2019-08-11 DIAGNOSIS — Z1389 Encounter for screening for other disorder: Secondary | ICD-10-CM

## 2019-08-11 DIAGNOSIS — Z331 Pregnant state, incidental: Secondary | ICD-10-CM | POA: Diagnosis not present

## 2019-08-11 LAB — POCT URINALYSIS DIPSTICK OB
Blood, UA: NEGATIVE
Glucose, UA: NEGATIVE
Ketones, UA: NEGATIVE
Nitrite, UA: NEGATIVE
POC,PROTEIN,UA: NEGATIVE

## 2019-08-11 NOTE — Progress Notes (Signed)
   LOW-RISK PREGNANCY VISIT Patient name: Emily [name] MRN [id number]  Date of birth: 11-21-1995 Chief Complaint:   Routine Prenatal Visit  History of Present Illness:   Emily [name] is a 24 y.o. G66P0010 female at 8w0dwith an Estimated Date of Delivery: 10/20/19 being seen today for ongoing management of a low-risk pregnancy.  Today she reports no complaints. Contractions: Not present. Vag. Bleeding: None.  Movement: Present. denies leaking of fluid. Review of Systems:   Pertinent items are noted in HPI Denies abnormal vaginal discharge w/ itching/odor/irritation, headaches, visual changes, shortness of breath, chest pain, abdominal pain, severe nausea/vomiting, or problems with urination or bowel movements unless otherwise stated above. Pertinent History Reviewed:  Reviewed past medical,surgical, social, obstetrical and family history.  Reviewed problem list, medications and allergies. Physical Assessment:   Vitals:   08/11/19 1145  BP: 124/78  Pulse: 97  Weight: 157 lb 8 oz (71.4 kg)  Body mass index is 23.95 kg/m.        Physical Examination:   General appearance: Well appearing, and in no distress  Mental status: Alert, oriented to person, place, and time  Skin: Warm & dry  Cardiovascular: Normal heart rate noted  Respiratory: Normal respiratory effort, no distress  Abdomen: Soft, gravid, nontender  Pelvic: Cervical exam deferred         Extremities: Edema: Trace  Fetal Status: Fetal Heart Rate (bpm): 131 Fundal Height: 28 cm Movement: Present    Chaperone: n/a    Results for orders placed or performed in visit on 08/11/19 (from the past 24 hour(s))  POC Urinalysis Dipstick OB   Collection Time: 08/11/19 11:46 AM  Result Value Ref Range   Color, UA     Clarity, UA     Glucose, UA Negative Negative   Bilirubin, UA     Ketones, UA neg    Spec Grav, UA     Blood, UA neg    pH, UA     POC,PROTEIN,UA Negative Negative, Trace, Small (1+), Moderate (2+), Large  (3+), 4+   Urobilinogen, UA     Nitrite, UA neg    Leukocytes, UA Small (1+) (A) Negative   Appearance     Odor      Assessment & Plan:  1) Low-risk pregnancy G2P0010 at 333w0dith an Estimated Date of Delivery: 10/20/19   2) + alpha thalassemia, FOB getting tested (needs new kit from company, pt on it)   Meds: No orders of the defined types were placed in this encounter.  Labs/procedures today:   Plan:  Continue routine obstetrical care  Next visit: prefers in person    Reviewed: Preterm labor symptoms and general obstetric precautions including but not limited to vaginal bleeding, contractions, leaking of fluid and fetal movement were reviewed in detail with the patient. Has All questions were answered.Has home bp cuff. Check bp weekly, let usKoreanow if >140/90.   Follow-up: Return in about 2 weeks (around 08/25/2019) for [place] Orders Placed This Encounter  Procedures  . POC Urinalysis Dipstick OB   FrChristin FudgeNP, CNM 08/11/2019 12:16 PM

## 2019-08-11 NOTE — Patient Instructions (Signed)
Emily [name], I greatly value your feedback.  If you receive a survey following your visit with [place] today, we appreciate you taking the time to fill it out.  Thanks, Cathie Beams, DNP, CNM  [facility] HAS MOVED!!! It is now [facility] ([address] [place], [place] [address]) Entrance located off of [address] Free 24/7 valet parking   Go to [email or web address] to register for FREE online childbirth classes    Call the office [phone]) or go to [facility] if:  You begin to have strong, frequent contractions  Your water breaks.  Sometimes it is a big gush of fluid, sometimes it is just a trickle that keeps getting your panties wet or running down your legs  You have vaginal bleeding.  It is normal to have a small amount of spotting if your cervix was checked.   You don't feel your baby moving like normal.  If you don't, get you something to eat and drink and lay down and focus on feeling your baby move.  You should feel at least 10 movements in 2 hours.  If you don't, you should call the office or go to [facility]   Home Blood Pressure Monitoring for Patients   Your provider has recommended that you check your blood pressure (BP) at least once a week at home. If you do not have a blood pressure cuff at home, one will be provided for you. Contact your provider if you have not received your monitor within 1 week.   Helpful Tips for Accurate Home Blood Pressure Checks  . Don't smoke, exercise, or drink caffeine 30 minutes before checking your BP . Use the restroom before checking your BP (a full bladder can raise your pressure) . Relax in a comfortable upright chair . Feet on the ground . Left arm resting comfortably on a flat surface at the level of your heart . Legs uncrossed . Back supported . Sit quietly and don't talk . Place the cuff on your bare arm . Adjust snuggly, so that only two fingertips can fit  between your skin and the top of the cuff . Check 2 readings separated by at least one minute . Keep a log of your BP readings . For a visual, please reference this diagram: http://ccnc.care/bpdiagram  Provider Name: [facility]     Phone: [phone]  Zone 1: ALL CLEAR  Continue to monitor your symptoms:  . BP reading is less than 140 (top number) or less than 90 (bottom number)  . No right upper stomach pain . No headaches or seeing spots . No feeling nauseated or throwing up . No swelling in [name] and hands  Zone 2: CAUTION Call your doctor's office for any of the following:  . BP reading is greater than 140 (top number) or greater than 90 (bottom number)  . Stomach pain under your ribs in the middle or right side . Headaches or seeing spots . Feeling nauseated or throwing up . Swelling in [name] and hands  Zone 3: EMERGENCY  Seek immediate medical care if you have any of the following:  . BP reading is greater than160 (top number) or greater than 110 (bottom number) . Severe headaches not improving with Tylenol . Serious difficulty catching your breath . Any worsening symptoms from Zone 2

## 2019-08-16 ENCOUNTER — Telehealth: Payer: Self-pay | Admitting: Women's Health

## 2019-08-16 NOTE — Telephone Encounter (Signed)
Telephoned patient at home number and advised patient could try Zantac, Prilosec, or Tums. Patient voiced understanding.

## 2019-08-16 NOTE — Telephone Encounter (Signed)
Patient mom called to report symptoms of patient that started last night. Patient mom states that she started having some burning in chest/throat last night when she laid down, also advised that it was not long after she ate some food.

## 2019-08-29 ENCOUNTER — Encounter: Payer: Self-pay | Admitting: Women's Health

## 2019-08-29 ENCOUNTER — Ambulatory Visit (INDEPENDENT_AMBULATORY_CARE_PROVIDER_SITE_OTHER): Payer: 59 | Admitting: Women's Health

## 2019-08-29 VITALS — BP 116/63 | HR 101 | Wt 162.0 lb

## 2019-08-29 DIAGNOSIS — Z331 Pregnant state, incidental: Secondary | ICD-10-CM

## 2019-08-29 DIAGNOSIS — Z3A32 32 weeks gestation of pregnancy: Secondary | ICD-10-CM

## 2019-08-29 DIAGNOSIS — Z3483 Encounter for supervision of other normal pregnancy, third trimester: Secondary | ICD-10-CM

## 2019-08-29 DIAGNOSIS — Z1389 Encounter for screening for other disorder: Secondary | ICD-10-CM | POA: Diagnosis not present

## 2019-08-29 DIAGNOSIS — Z3480 Encounter for supervision of other normal pregnancy, unspecified trimester: Secondary | ICD-10-CM

## 2019-08-29 LAB — POCT URINALYSIS DIPSTICK OB
Blood, UA: NEGATIVE
Glucose, UA: NEGATIVE
Leukocytes, UA: NEGATIVE
Nitrite, UA: NEGATIVE
POC,PROTEIN,UA: NEGATIVE

## 2019-08-29 NOTE — Progress Notes (Signed)
LOW-RISK PREGNANCY VISIT Patient name: Emily [name] MRN [id number]  Date of birth: 1995-05-31 Chief Complaint:   Routine Prenatal Visit  History of Present Illness:   Emily [name] is a 24 y.o. G49P0010 female at [redacted]w[redacted]d with an Estimated Date of Delivery: 10/20/19 being seen today for ongoing management of a low-risk pregnancy.  Depression screen [facility] 2/9 07/14/2019 04/12/2019 01/12/2019  Decreased Interest 0 0 0  Down, Depressed, Hopeless 0 0 0  PHQ - 2 Score 0 0 0  Altered sleeping 0 0 -  Tired, decreased energy 1 0 -  Change in appetite 0 0 -  Feeling bad or failure about yourself  0 0 -  Trouble concentrating 0 0 -  Moving slowly or fidgety/restless 0 0 -  Suicidal thoughts 0 0 -  PHQ-9 Score 1 0 -  Difficult doing work/chores - Not difficult at all -  Some encounter information is confidential and restricted. Go to Review Flowsheets activity to see all data.    Today she reports pelvic pain when getting up, goes away after walking. Contractions: Not present. Vag. Bleeding: None.  Movement: Present. denies leaking of fluid. Review of Systems:   Pertinent items are noted in HPI Denies abnormal vaginal discharge w/ itching/odor/irritation, headaches, visual changes, shortness of breath, chest pain, abdominal pain, severe nausea/vomiting, or problems with urination or bowel movements unless otherwise stated above. Pertinent History Reviewed:  Reviewed past medical,surgical, social, obstetrical and family history.  Reviewed problem list, medications and allergies. Physical Assessment:   Vitals:   08/29/19 1054  BP: 116/63  Pulse: (!) 101  Weight: 162 lb (73.5 kg)  Body mass index is 24.63 kg/m.        Physical Examination:   General appearance: Well appearing, and in no distress  Mental status: Alert, oriented to person, place, and time  Skin: Warm & dry  Cardiovascular: Normal heart rate noted  Respiratory: Normal respiratory effort, no distress  Abdomen: Soft, gravid,  nontender  Pelvic: Cervical exam deferred         Extremities: Edema: Trace  Fetal Status: Fetal Heart Rate (bpm): 131 Fundal Height: 32 cm Movement: Present    Chaperone: n/a    Results for orders placed or performed in visit on 08/29/19 (from the past 24 hour(s))  POC Urinalysis Dipstick OB   Collection Time: 08/29/19 10:52 AM  Result Value Ref Range   Color, UA     Clarity, UA     Glucose, UA Negative Negative   Bilirubin, UA     Ketones, UA 2+    Spec Grav, UA     Blood, UA neg    pH, UA     POC,PROTEIN,UA Negative Negative, Trace, Small (1+), Moderate (2+), Large (3+), 4+   Urobilinogen, UA     Nitrite, UA neg    Leukocytes, UA Negative Negative   Appearance     Odor      Assessment & Plan:  1) Low-risk pregnancy G2P0010 at [redacted]w[redacted]d with an Estimated Date of Delivery: 10/20/19    Meds: No orders of the defined types were placed in this encounter.  Labs/procedures today: none  Plan:  Continue routine obstetrical care  Next visit: prefers in person    Reviewed: Preterm labor symptoms and general obstetric precautions including but not limited to vaginal bleeding, contractions, leaking of fluid and fetal movement were reviewed in detail with the patient.  All questions were answered. Has home bp cuff. Check bp weekly, let us know if >  140/90.   Follow-up: Return in about 2 weeks (around 09/12/2019) for LROB, in person, CNM.  Orders Placed This Encounter  Procedures  . POC Urinalysis Dipstick OB   Cheral Marker CNM, [facility] 08/29/2019 11:18 AM

## 2019-08-29 NOTE — Patient Instructions (Signed)
Emily [name], I greatly value your feedback.  If you receive a survey following your visit with [place] today, we appreciate you taking the time to fill it out.  Thanks, Joellyn Haff, CNM, WHNP-BC  [facility] ([address] [place], [place] [address]) Entrance C, located off of [address] valet parking   Go to [email or web address] to register for FREE online childbirth classes    Call the office [phone]) or go to [facility] if:  You begin to have strong, frequent contractions  Your water breaks.  Sometimes it is a big gush of fluid, sometimes it is just a trickle that keeps getting your panties wet or running down your legs  You have vaginal bleeding.  It is normal to have a small amount of spotting if your cervix was checked.   You don't feel your baby moving like normal.  If you don't, get you something to eat and drink and lay down and focus on feeling your baby move.  You should feel at least 10 movements in 2 hours.  If you don't, you should call the office or go to [facility].   Call the office [phone]) or go to [facility] hospital for these signs of pre-eclampsia:  Severe headache that does not go away with Tylenol  Visual changes- seeing spots, double, blurred vision  Pain under your right breast or upper abdomen that does not go away with Tums or heartburn medicine  Nausea and/or vomiting  Severe swelling in your hands, feet, and [name]    Home Blood Pressure Monitoring for Patients   Your provider has recommended that you check your blood pressure (BP) at least once a week at home. If you do not have a blood pressure cuff at home, one will be provided for you. Contact your provider if you have not received your monitor within 1 week.   Helpful Tips for Accurate Home Blood Pressure Checks   Don't smoke, exercise, or drink caffeine 30 minutes before checking your BP  Use the restroom before checking your BP (a full  bladder can raise your pressure)  Relax in a comfortable upright chair  Feet on the ground  Left arm resting comfortably on a flat surface at the level of your heart  Legs uncrossed  Back supported  Sit quietly and don't talk  Place the cuff on your bare arm  Adjust snuggly, so that only two fingertips can fit between your skin and the top of the cuff  Check 2 readings separated by at least one minute  Keep a log of your BP readings  For a visual, please reference this diagram: http://ccnc.care/bpdiagram  Provider Name: [facility]     Phone: [phone]  Zone 1: ALL CLEAR  Continue to monitor your symptoms:   BP reading is less than 140 (top number) or less than 90 (bottom number)   No right upper stomach pain  No headaches or seeing spots  No feeling nauseated or throwing up  No swelling in [name] and hands  Zone 2: CAUTION Call your doctor's office for any of the following:   BP reading is greater than 140 (top number) or greater than 90 (bottom number)   Stomach pain under your ribs in the middle or right side  Headaches or seeing spots  Feeling nauseated or throwing up  Swelling in [name] and hands  Zone 3: EMERGENCY  Seek immediate medical care if you have any of  the following:   BP reading is greater than160 (top number) or greater than 110 (bottom number)  Severe headaches not improving with Tylenol  Serious difficulty catching your breath  Any worsening symptoms from Zone 2  Preterm Labor and Birth Information  The normal length of a pregnancy is 39-41 weeks. Preterm labor is when labor starts before 37 completed weeks of pregnancy. What are the risk factors for preterm labor? Preterm labor is more likely to occur in women who:  Have certain infections during pregnancy such as a bladder infection, sexually transmitted infection, or infection inside the uterus (chorioamnionitis).  Have a shorter-than-normal cervix.  Have gone into  preterm labor before.  Have had surgery on their cervix.  Are younger than age 41 or older than age 1.  Are African American.  Are pregnant with twins or multiple babies (multiple gestation).  Take street drugs or smoke while pregnant.  Do not gain enough weight while pregnant.  Became pregnant shortly after having been pregnant. What are the symptoms of preterm labor? Symptoms of preterm labor include:  Cramps similar to those that can happen during a menstrual period. The cramps may happen with diarrhea.  Pain in the abdomen or lower back.  Regular uterine contractions that may feel like tightening of the abdomen.  A feeling of increased pressure in the pelvis.  Increased watery or bloody mucus discharge from the vagina.  Water breaking (ruptured amniotic sac). Why is it important to recognize signs of preterm labor? It is important to recognize signs of preterm labor because babies who are born prematurely may not be fully developed. This can put them at an increased risk for:  Long-term (chronic) heart and lung problems.  Difficulty immediately after birth with regulating body systems, including blood sugar, body temperature, heart rate, and breathing rate.  Bleeding in the brain.  Cerebral palsy.  Learning difficulties.  Death. These risks are highest for babies who are born before 76 weeks of pregnancy. How is preterm labor treated? Treatment depends on the length of your pregnancy, your condition, and the health of your baby. It may involve: 1. Having a stitch (suture) placed in your cervix to prevent your cervix from opening too early (cerclage). 2. Taking or being given medicines, such as: ? Hormone medicines. These may be given early in pregnancy to help support the pregnancy. ? Medicine to stop contractions. ? Medicines to help mature the babys lungs. These may be prescribed if the risk of delivery is high. ? Medicines to prevent your baby from  developing cerebral palsy. If the labor happens before 34 weeks of pregnancy, you may need to stay in the hospital. What should I do if I think I am in preterm labor? If you think that you are going into preterm labor, call your health care provider right away. How can I prevent preterm labor in future pregnancies? To increase your chance of having a full-term pregnancy:  Do not use any tobacco products, such as cigarettes, chewing tobacco, and e-cigarettes. If you need help quitting, ask your health care provider.  Do not use street drugs or medicines that have not been prescribed to you during your pregnancy.  Talk with your health care provider before taking any herbal supplements, even if you have been taking them regularly.  Make sure you gain a healthy amount of weight during your pregnancy.  Watch for infection. If you think that you might have an infection, get it checked right away.  Make sure to  tell your health care provider if you have gone into preterm labor before. This information is not intended to replace advice given to you by your health care provider. Make sure you discuss any questions you have with your health care provider. Document Revised: 04/16/2018 Document Reviewed: 05/16/2015 Elsevier Patient Education  [employer].

## 2019-08-30 DIAGNOSIS — Z3482 Encounter for supervision of other normal pregnancy, second trimester: Secondary | ICD-10-CM | POA: Diagnosis not present

## 2019-08-30 DIAGNOSIS — Z3483 Encounter for supervision of other normal pregnancy, third trimester: Secondary | ICD-10-CM | POA: Diagnosis not present

## 2019-09-13 ENCOUNTER — Encounter: Payer: 59 | Admitting: Women's Health

## 2019-09-19 ENCOUNTER — Encounter: Payer: Self-pay | Admitting: Women's Health

## 2019-09-19 ENCOUNTER — Other Ambulatory Visit: Payer: Self-pay

## 2019-09-19 ENCOUNTER — Ambulatory Visit (INDEPENDENT_AMBULATORY_CARE_PROVIDER_SITE_OTHER): Payer: 59 | Admitting: Women's Health

## 2019-09-19 VITALS — BP 114/71 | HR 91 | Wt 166.8 lb

## 2019-09-19 DIAGNOSIS — Z331 Pregnant state, incidental: Secondary | ICD-10-CM | POA: Diagnosis not present

## 2019-09-19 DIAGNOSIS — Z1389 Encounter for screening for other disorder: Secondary | ICD-10-CM

## 2019-09-19 DIAGNOSIS — Z3483 Encounter for supervision of other normal pregnancy, third trimester: Secondary | ICD-10-CM

## 2019-09-19 DIAGNOSIS — Z3480 Encounter for supervision of other normal pregnancy, unspecified trimester: Secondary | ICD-10-CM

## 2019-09-19 DIAGNOSIS — Z3A35 35 weeks gestation of pregnancy: Secondary | ICD-10-CM

## 2019-09-19 LAB — POCT URINALYSIS DIPSTICK OB
Blood, UA: NEGATIVE
Glucose, UA: NEGATIVE
Ketones, UA: NEGATIVE
Nitrite, UA: NEGATIVE
POC,PROTEIN,UA: NEGATIVE

## 2019-09-19 NOTE — Patient Instructions (Signed)
Emily [name], I greatly value your feedback.  If you receive a survey following your visit with us today, we appreciate you taking the time to fill it out.  °Thanks, °Emily [name], CNM, WHNP-BC ° °[facility] °([address] [place], [place] [address]) °Entrance C, located off of [address] °Free 24/7 valet parking  ° °Go to [email or web address] to register for FREE online childbirth classes  °  °Call the office ([phone]) or go to [facility] if: °· You begin to have strong, frequent contractions °· Your water breaks.  Sometimes it is a big gush of fluid, sometimes it is just a trickle that keeps getting your panties wet or running down your legs °· You have vaginal bleeding.  It is normal to have a small amount of spotting if your cervix was checked.  °· You don't feel your baby moving like normal.  If you don't, get you something to eat and drink and lay down and focus on feeling your baby move.  You should feel at least 10 movements in 2 hours.  If you don't, you should call the office or go to [facility].  ° °Call the office ([phone]) or [facility] to [facility] for these signs of pre-eclampsia: °· Severe headache that does not go away with Tylenol °· Visual changes- seeing spots, double, blurred vision °· Pain under your right breast or upper abdomen that does not go away with Tums or heartburn medicine °· Nausea and/or vomiting °· Severe swelling in your hands, feet, and face  °  °Home Blood Pressure Monitoring for Patients  ° °Your provider has recommended that you check your blood pressure (BP) at least once a week at home. If you do not have a blood pressure cuff at home, one will be provided for you. Contact your provider if you have not received your monitor within 1 week.  ° °Helpful Tips for Accurate Home Blood Pressure Checks  °• Don't smoke, exercise, or drink caffeine 30 minutes before checking your BP °• Use the restroom before checking your BP (a full  bladder can raise your pressure) °• Relax in a comfortable upright chair °• Feet on the ground °• Left arm resting comfortably on a flat surface at the level of your heart °• Legs uncrossed °• Back supported °• Sit quietly and don't talk °• Place the cuff on your bare arm °• Adjust snuggly, so that only two fingertips can fit between your skin and the top of the cuff °• Check 2 readings separated by at least one minute °• Keep a log of your BP readings °• For a visual, please reference this diagram: http://ccnc.care/bpdiagram ° °Provider Name: [facility]     Phone: [phone] ° °Zone 1: ALL CLEAR  °Continue to monitor your symptoms:  °• BP reading is less than 140 (top number) or less than 90 (bottom number)  °• No right upper stomach pain °• No headaches or seeing spots °• No feeling nauseated or throwing up °• No swelling in face and hands ° °Zone 2: CAUTION °Call your doctor's office for any of the following:  °• BP reading is greater than 140 (top number) or greater than 90 (bottom number)  °• Stomach pain under your ribs in the middle or right side °• Headaches or seeing spots °• Feeling nauseated or throwing up °• Swelling in face and hands ° °Zone 3: EMERGENCY  °Seek immediate medical care if you have any of   the following:  . BP reading is greater than160 (top number) or greater than 110 (bottom number) . Severe headaches not improving with Tylenol . Serious difficulty catching your breath . Any worsening symptoms from Zone 2  Preterm Labor and Birth Information  The normal length of a pregnancy is 39-41 weeks. Preterm labor is when labor starts before 37 completed weeks of pregnancy. What are the risk factors for preterm labor? Preterm labor is more likely to occur in women who:  Have certain infections during pregnancy such as a bladder infection, sexually transmitted infection, or infection inside the uterus (chorioamnionitis).  Have a shorter-than-normal cervix.  Have gone into  preterm labor before.  Have had surgery on their cervix.  Are younger than age 54 or older than age 25.  Are African American.  Are pregnant with twins or multiple babies (multiple gestation).  Take street drugs or smoke while pregnant.  Do not gain enough weight while pregnant.  Became pregnant shortly after having been pregnant. What are the symptoms of preterm labor? Symptoms of preterm labor include:  Cramps similar to those that can happen during a menstrual period. The cramps may happen with diarrhea.  Pain in the abdomen or lower back.  Regular uterine contractions that may feel like tightening of the abdomen.  A feeling of increased pressure in the pelvis.  Increased watery or bloody mucus discharge from the vagina.  Water breaking (ruptured amniotic sac). Why is it important to recognize signs of preterm labor? It is important to recognize signs of preterm labor because babies who are born prematurely may not be fully developed. This can put them at an increased risk for:  Long-term (chronic) heart and lung problems.  Difficulty immediately after birth with regulating body systems, including blood sugar, body temperature, heart rate, and breathing rate.  Bleeding in the brain.  Cerebral palsy.  Learning difficulties.  Death. These risks are highest for babies who are born before 48 weeks of pregnancy. How is preterm labor treated? Treatment depends on the length of your pregnancy, your condition, and the health of your baby. It may involve: 1. Having a stitch (suture) placed in your cervix to prevent your cervix from opening too early (cerclage). 2. Taking or being given medicines, such as: ? Hormone medicines. These may be given early in pregnancy to help support the pregnancy. ? Medicine to stop contractions. ? Medicines to help mature the baby's lungs. These may be prescribed if the risk of delivery is high. ? Medicines to prevent your baby from  developing cerebral palsy. If the labor happens before 34 weeks of pregnancy, you may need to stay in the hospital. What should I do if I think I am in preterm labor? If you think that you are going into preterm labor, call your health care provider right away. How can I prevent preterm labor in future pregnancies? To increase your chance of having a full-term pregnancy:  Do not use any tobacco products, such as cigarettes, chewing tobacco, and e-cigarettes. If you need help quitting, ask your health care provider.  Do not use street drugs or medicines that have not been prescribed to you during your pregnancy.  Talk with your health care provider before taking any herbal supplements, even if you have been taking them regularly.  Make sure you gain a healthy amount of weight during your pregnancy.  Watch for infection. If you think that you might have an infection, get it checked right away.  Make sure to  tell your health care provider if you have gone into preterm labor before. This information is not intended to replace advice given to you by your health care provider. Make sure you discuss any questions you have with your health care provider. Document Revised: 04/16/2018 Document Reviewed: 05/16/2015 Elsevier Patient Education  [place].

## 2019-09-19 NOTE — Progress Notes (Signed)
LOW-RISK PREGNANCY VISIT Patient name: Emily [name] MRN [id number]  Date of birth: 1995-08-28 Chief Complaint:   Routine Prenatal Visit  History of Present Illness:   Emily [name] is a 24 y.o. G8P0010 female at 46w4dwith an Estimated Date of Delivery: 10/20/19 being seen today for ongoing management of a low-risk pregnancy.  [facility] 07/14/2019 04/12/2019 01/12/2019  Decreased Interest 0 0 0  Down, Depressed, Hopeless 0 0 0  PHQ - 2 Score 0 0 0  Altered sleeping 0 0 -  Tired, decreased energy 1 0 -  Change in appetite 0 0 -  Feeling bad or failure about yourself  0 0 -  Trouble concentrating 0 0 -  Moving slowly or fidgety/restless 0 0 -  Suicidal thoughts 0 0 -  PHQ-9 Score 1 0 -  Difficult doing work/chores - Not difficult at all -  Some encounter information is confidential and restricted. Go to Review Flowsheets activity to see all data.    Today she reports no complaints. Phelbotomist came out to draw FOBs blood for alpha thal testing, tube wouldn't draw it, so was told would need to get more tubes or go to a lab to draw. Contractions: Irregular. Vag. Bleeding: None.  Movement: Present. denies leaking of fluid. Review of Systems:   Pertinent items are noted in HPI Denies abnormal vaginal discharge w/ itching/odor/irritation, headaches, visual changes, shortness of breath, chest pain, abdominal pain, severe nausea/vomiting, or problems with urination or bowel movements unless otherwise stated above. Pertinent History Reviewed:  Reviewed past medical,surgical, social, obstetrical and family history.  Reviewed problem list, medications and allergies. Physical Assessment:   Vitals:   09/19/19 1156  BP: 114/71  Pulse: 91  Weight: 166 lb 12.8 oz (75.7 kg)  Body mass index is 25.36 kg/m.        Physical Examination:   General appearance: Well appearing, and in no distress  Mental status: Alert, oriented to person, place, and time  Skin: Warm &  dry  Cardiovascular: Normal heart rate noted  Respiratory: Normal respiratory effort, no distress  Abdomen: Soft, gravid, nontender  Pelvic: Cervical exam deferred         Extremities: Edema: Trace  Fetal Status: Fetal Heart Rate (bpm): 125 Fundal Height: 35 cm Movement: Present    Chaperone: n/a    Results for orders placed or performed in visit on 09/19/19 (from the past 24 hour(s))  POC Urinalysis Dipstick OB   Collection Time: 09/19/19 11:57 AM  Result Value Ref Range   Color, UA     Clarity, UA     Glucose, UA Negative Negative   Bilirubin, UA     Ketones, UA neg    Spec Grav, UA     Blood, UA neg    pH, UA     POC,PROTEIN,UA Negative Negative, Trace, Small (1+), Moderate (2+), Large (3+), 4+   Urobilinogen, UA     Nitrite, UA neg    Leukocytes, UA Trace (A) Negative   Appearance     Odor      Assessment & Plan:  1) Low-risk pregnancy G2P0010 at 310w4dith an Estimated Date of Delivery: 10/20/19   2) Alpha thalassemia carrier, FOB is wanting testing, tube wouldn't draw when phlebotomist came out, note routed to [facility] to see if they can get him a cheek swab kit   Meds: No orders of the defined types were placed in this encounter.  Labs/procedures today: none  Plan:  Continue routine  obstetrical care  Next visit: prefers will be in person for gbs    Reviewed: Preterm labor symptoms and general obstetric precautions including but not limited to vaginal bleeding, contractions, leaking of fluid and fetal movement were reviewed in detail with the patient.  All questions were answered. Has home bp cuff.  Check bp weekly, let us know if >140/90.   Follow-up: Return in about 1 week (around 09/26/2019) for [place], [place], in person.  Orders Placed This Encounter  Procedures  . POC Urinalysis Dipstick OB   Roma Schanz CNM, [facility] 09/19/2019 12:34 PM

## 2019-09-21 ENCOUNTER — Encounter: Payer: Self-pay | Admitting: *Deleted

## 2019-09-23 DIAGNOSIS — M5489 Other dorsalgia: Secondary | ICD-10-CM | POA: Diagnosis not present

## 2019-09-26 DIAGNOSIS — R609 Edema, unspecified: Secondary | ICD-10-CM | POA: Diagnosis not present

## 2019-09-28 ENCOUNTER — Ambulatory Visit (INDEPENDENT_AMBULATORY_CARE_PROVIDER_SITE_OTHER): Payer: 59 | Admitting: Advanced Practice Midwife

## 2019-09-28 ENCOUNTER — Other Ambulatory Visit (HOSPITAL_COMMUNITY)
Admission: RE | Admit: 2019-09-28 | Discharge: 2019-09-28 | Disposition: A | Discharge status: Home / Self Care | Payer: 59 | Source: Ambulatory Visit | Attending: Advanced Practice Midwife | Admitting: Advanced Practice Midwife

## 2019-09-28 VITALS — BP 121/70 | HR 94 | Wt 168.0 lb

## 2019-09-28 DIAGNOSIS — Z3A36 36 weeks gestation of pregnancy: Secondary | ICD-10-CM

## 2019-09-28 DIAGNOSIS — Z1389 Encounter for screening for other disorder: Secondary | ICD-10-CM

## 2019-09-28 DIAGNOSIS — Z7689 Persons encountering health services in other specified circumstances: Secondary | ICD-10-CM | POA: Diagnosis not present

## 2019-09-28 DIAGNOSIS — Z3480 Encounter for supervision of other normal pregnancy, unspecified trimester: Secondary | ICD-10-CM

## 2019-09-28 DIAGNOSIS — Z3483 Encounter for supervision of other normal pregnancy, third trimester: Secondary | ICD-10-CM

## 2019-09-28 DIAGNOSIS — Z331 Pregnant state, incidental: Secondary | ICD-10-CM

## 2019-09-28 LAB — POCT URINALYSIS DIPSTICK OB
Blood, UA: NEGATIVE
Glucose, UA: NEGATIVE
Ketones, UA: NEGATIVE
Leukocytes, UA: NEGATIVE
Nitrite, UA: NEGATIVE
POC,PROTEIN,UA: NEGATIVE

## 2019-09-28 NOTE — Progress Notes (Signed)
   LOW-RISK PREGNANCY VISIT Patient name: Emily [name] MRN [id number]  Date of birth: 30-Nov-1995 Chief Complaint:   Routine Prenatal Visit (gbs-gc-chl)  History of Present Illness:   Emily [name] is a 24 y.o. G57P0010 female at [redacted]w[redacted]d with an Estimated Date of Delivery: 10/20/19 being seen today for ongoing management of a low-risk pregnancy.  Today she reports intermittent pain along the sides of her legs; BLE edema- no s/s pre-e. Contractions: Irritability. Vag. Bleeding: None.  Movement: Present. denies leaking of fluid. Review of Systems:   Pertinent items are noted in HPI Denies abnormal vaginal discharge w/ itching/odor/irritation, headaches, visual changes, shortness of breath, chest pain, abdominal pain, severe nausea/vomiting, or problems with urination or bowel movements unless otherwise stated above. Pertinent History Reviewed:  Reviewed past medical,surgical, social, obstetrical and family history.  Reviewed problem list, medications and allergies. Physical Assessment:   Vitals:   09/28/19 1421  BP: 121/70  Pulse: 94  Weight: 168 lb (76.2 kg)  Body mass index is 25.54 kg/m.        Physical Examination:   General appearance: Well appearing, and in no distress  Mental status: Alert, oriented to person, place, and time  Skin: Warm & dry  Cardiovascular: Normal heart rate noted  Respiratory: Normal respiratory effort, no distress  Abdomen: Soft, gravid, nontender  Pelvic: Cervical exam performed  Dilation: Closed Effacement (%): Thick Station: -3  Extremities: Edema: Trace  Fetal Status: Fetal Heart Rate (bpm): 141 Fundal Height: 37 cm Movement: Present Presentation: Vertex  Results for orders placed or performed in visit on 09/28/19 (from the past 24 hour(s))  POC Urinalysis Dipstick OB   Collection Time: 09/28/19  2:25 PM  Result Value Ref Range   Color, UA     Clarity, UA     Glucose, UA Negative Negative   Bilirubin, UA     Ketones, UA neg    Spec Grav, UA      Blood, UA neg    pH, UA     POC,PROTEIN,UA Negative Negative, Trace, Small (1+), Moderate (2+), Large (3+), 4+   Urobilinogen, UA     Nitrite, UA neg    Leukocytes, UA Negative Negative   Appearance     Odor      Assessment & Plan:  1) Low-risk pregnancy G2P0010 at [redacted]w[redacted]d with an Estimated Date of Delivery: 10/20/19   2) Sciatica, rev'd normal in preg  3) BLE, tips & warning signs given   Meds: No orders of the defined types were placed in this encounter.  Labs/procedures today: GBS/cultures  Plan:  Continue routine obstetrical care   Reviewed: Term labor symptoms and general obstetric precautions including but not limited to vaginal bleeding, contractions, leaking of fluid and fetal movement were reviewed in detail with the patient.  All questions were answered. Has home bp cuff. Check bp weekly, let us know if >140/90.   Follow-up: Return in about 1 week (around 10/05/2019) for LROB, in person.  Orders Placed This Encounter  Procedures  . Strep Gp B NAA  . POC Urinalysis Dipstick OB   Arabella Merles [facility] 09/28/2019 2:57 PM

## 2019-09-28 NOTE — Patient Instructions (Signed)
Braxton Hicks Contractions °Contractions of the uterus can occur throughout pregnancy, but they are not always a sign that you are in labor. You may have practice contractions called Braxton Hicks contractions. These false labor contractions are sometimes confused with true labor. °What are Braxton Hicks contractions? °Braxton Hicks contractions are tightening movements that occur in the muscles of the uterus before labor. Unlike true labor contractions, these contractions do not result in opening (dilation) and thinning of the cervix. Toward the end of pregnancy (32-34 weeks), Braxton Hicks contractions can happen more often and may become stronger. These contractions are sometimes difficult to tell apart from true labor because they can be very uncomfortable. You should not feel embarrassed if you go to the hospital with false labor. °Sometimes, the only way to tell if you are in true labor is for your health care provider to look for changes in the cervix. The health care provider will do a physical exam and may monitor your contractions. If you are not in true labor, the exam should show that your cervix is not dilating and your water has not broken. °If there are no other health problems associated with your pregnancy, it is completely safe for you to be sent home with false labor. You may continue to have Braxton Hicks contractions until you go into true labor. °How to tell the difference between true labor and false labor °True labor °· Contractions last 30-70 seconds. °· Contractions become very regular. °· Discomfort is usually felt in the top of the uterus, and it spreads to the lower abdomen and low back. °· Contractions do not go away with walking. °· Contractions usually become more intense and increase in frequency. °· The cervix dilates and gets thinner. °False labor °· Contractions are usually shorter and not as strong as true labor contractions. °· Contractions are usually irregular. °· Contractions  are often felt in the front of the lower abdomen and in the groin. °· Contractions may go away when you walk around or change positions while lying down. °· Contractions get weaker and are shorter-lasting as time goes on. °· The cervix usually does not dilate or become thin. °Follow these instructions at home: ° °· Take over-the-counter and prescription medicines only as told by your health care provider. °· Keep up with your usual exercises and follow other instructions from your health care provider. °· Eat and drink lightly if you think you are going into labor. °· If Braxton Hicks contractions are making you uncomfortable: °? Change your position from lying down or resting to walking, or change from walking to resting. °? Sit and rest in a tub of warm water. °? Drink enough fluid to keep your urine pale yellow. Dehydration may cause these contractions. °? Do slow and deep breathing several times an hour. °· Keep all follow-up prenatal visits as told by your health care provider. This is important. °Contact a health care provider if: °· You have a fever. °· You have continuous pain in your abdomen. °Get help right away if: °· Your contractions become stronger, more regular, and closer together. °· You have fluid leaking or gushing from your vagina. °· You pass blood-tinged mucus (bloody show). °· You have bleeding from your vagina. °· You have low back pain that you never had before. °· You feel your baby’s head pushing down and causing pelvic pressure. °· Your baby is not moving inside you as much as it used to. °Summary °· Contractions that occur before labor are   called Braxton Hicks contractions, false labor, or practice contractions. °· Braxton Hicks contractions are usually shorter, weaker, farther apart, and less regular than true labor contractions. True labor contractions usually become progressively stronger and regular, and they become more frequent. °· Manage discomfort from Braxton Hicks contractions  by changing position, resting in a warm bath, drinking plenty of water, or practicing deep breathing. °This information is not intended to replace advice given to you by your health care provider. Make sure you discuss any questions you have with your health care provider. °Document Revised: 12/05/2016 Document Reviewed: 05/08/2016 °Elsevier Patient Education © 2020 Elsevier Inc[employer] ° °

## 2019-09-30 LAB — CERVICOVAGINAL ANCILLARY ONLY
Chlamydia: NEGATIVE
Comment: NEGATIVE
Comment: NORMAL
Neisseria Gonorrhea: NEGATIVE

## 2019-09-30 LAB — STREP GP B NAA: Strep Gp B NAA: NEGATIVE

## 2019-10-05 ENCOUNTER — Ambulatory Visit (INDEPENDENT_AMBULATORY_CARE_PROVIDER_SITE_OTHER): Payer: 59 | Admitting: Women's Health

## 2019-10-05 ENCOUNTER — Encounter: Payer: Self-pay | Admitting: Women's Health

## 2019-10-05 VITALS — BP 128/66 | HR 92 | Wt 170.4 lb

## 2019-10-05 DIAGNOSIS — D563 Thalassemia minor: Secondary | ICD-10-CM

## 2019-10-05 DIAGNOSIS — Z3483 Encounter for supervision of other normal pregnancy, third trimester: Secondary | ICD-10-CM

## 2019-10-05 DIAGNOSIS — Z3A37 37 weeks gestation of pregnancy: Secondary | ICD-10-CM

## 2019-10-05 DIAGNOSIS — Z3480 Encounter for supervision of other normal pregnancy, unspecified trimester: Secondary | ICD-10-CM

## 2019-10-05 DIAGNOSIS — R102 Pelvic and perineal pain: Secondary | ICD-10-CM | POA: Diagnosis not present

## 2019-10-05 DIAGNOSIS — Z331 Pregnant state, incidental: Secondary | ICD-10-CM

## 2019-10-05 DIAGNOSIS — Z1389 Encounter for screening for other disorder: Secondary | ICD-10-CM

## 2019-10-05 LAB — POCT URINALYSIS DIPSTICK OB
Blood, UA: NEGATIVE
Glucose, UA: NEGATIVE
Ketones, UA: NEGATIVE
Leukocytes, UA: NEGATIVE
Nitrite, UA: NEGATIVE
POC,PROTEIN,UA: NEGATIVE

## 2019-10-05 NOTE — Progress Notes (Signed)
LOW-RISK PREGNANCY VISIT Patient name: Emily [name] MRN [id number]  Date of birth: January 20, 1995 Chief Complaint:   Routine Prenatal Visit  History of Present Illness:   Emily [name] is a 24 y.o. G100P0010 female at [redacted]w[redacted]d with an Estimated Date of Delivery: 10/20/19 being seen today for ongoing management of a low-risk pregnancy.  Depression screen [facility] 2/9 07/14/2019 04/12/2019 01/12/2019  Decreased Interest 0 0 0  Down, Depressed, Hopeless 0 0 0  PHQ - 2 Score 0 0 0  Altered sleeping 0 0 -  Tired, decreased energy 1 0 -  Change in appetite 0 0 -  Feeling bad or failure about yourself  0 0 -  Trouble concentrating 0 0 -  Moving slowly or fidgety/restless 0 0 -  Suicidal thoughts 0 0 -  PHQ-9 Score 1 0 -  Difficult doing work/chores - Not difficult at all -  Some encounter information is confidential and restricted. Go to Review Flowsheets activity to see all data.    Today she reports no complaints. Contractions: Irregular. Vag. Bleeding: None.  Movement: Present. denies leaking of fluid. Review of Systems:   Pertinent items are noted in HPI Denies abnormal vaginal discharge w/ itching/odor/irritation, headaches, visual changes, shortness of breath, chest pain, abdominal pain, severe nausea/vomiting, or problems with urination or bowel movements unless otherwise stated above. Pertinent History Reviewed:  Reviewed past medical,surgical, social, obstetrical and family history.  Reviewed problem list, medications and allergies. Physical Assessment:   Vitals:   10/05/19 1430  BP: 128/66  Pulse: 92  Weight: 170 lb 6.4 oz (77.3 kg)  Body mass index is 25.91 kg/m.        Physical Examination:   General appearance: Well appearing, and in no distress  Mental status: Alert, oriented to person, place, and time  Skin: Warm & dry  Cardiovascular: Normal heart rate noted  Respiratory: Normal respiratory effort, no distress  Abdomen: Soft, gravid, nontender  Pelvic: Cervical exam  deferred         Extremities: Edema: Trace  Fetal Status: Fetal Heart Rate (bpm): 138 Fundal Height: 37 cm Movement: Present Presentation: Vertex  Chaperone: n/a    Results for orders placed or performed in visit on 10/05/19 (from the past 24 hour(s))  POC Urinalysis Dipstick OB   Collection Time: 10/05/19  2:28 PM  Result Value Ref Range   Color, UA     Clarity, UA     Glucose, UA Negative Negative   Bilirubin, UA     Ketones, UA n    Spec Grav, UA     Blood, UA n    pH, UA     POC,PROTEIN,UA Negative Negative, Trace, Small (1+), Moderate (2+), Large (3+), 4+   Urobilinogen, UA     Nitrite, UA n    Leukocytes, UA Negative Negative   Appearance     Odor      Assessment & Plan:  1) Low-risk pregnancy G2P0010 at [redacted]w[redacted]d with an Estimated Date of Delivery: 10/20/19   2) +carrier alpha thalassemia, FOB did get tested, results pending   Meds: No orders of the defined types were placed in this encounter.  Labs/procedures today: none  Plan:  Continue routine obstetrical care  Next visit: prefers in person    Reviewed: Term labor symptoms and general obstetric precautions including but not limited to vaginal bleeding, contractions, leaking of fluid and fetal movement were reviewed in detail with the patient.  All questions were answered. Has home bp cuff. Check bp  weekly, let us know if >140/90.   Follow-up: Return in about 1 week (around 10/12/2019) for LROB, in person, CNM.  Orders Placed This Encounter  Procedures  . POC Urinalysis Dipstick OB   Cheral Marker CNM, [facility] 10/05/2019 2:57 PM

## 2019-10-05 NOTE — Patient Instructions (Signed)
Emily [name], I greatly value your feedback.  If you receive a survey following your visit with [place] today, we appreciate you taking the time to fill it out.  Thanks, Joellyn Haff, CNM, WHNP-BC  [facility] ([address] [place], [place] [address]) Entrance C, located off of [address] valet parking   Go to [email or web address] to register for FREE online childbirth classes    Call the office [phone]) or go to [facility] if:  You begin to have strong, frequent contractions  Your water breaks.  Sometimes it is a big gush of fluid, sometimes it is just a trickle that keeps getting your panties wet or running down your legs  You have vaginal bleeding.  It is normal to have a small amount of spotting if your cervix was checked.   You don't feel your baby moving like normal.  If you don't, get you something to eat and drink and lay down and focus on feeling your baby move.  You should feel at least 10 movements in 2 hours.  If you don't, you should call the office or go to [facility]   Call the office [phone]) or go to [facility] hospital for these signs of pre-eclampsia:  Severe headache that does not go away with Tylenol  Visual changes- seeing spots, double, blurred vision  Pain under your right breast or upper abdomen that does not go away with Tums or heartburn medicine  Nausea and/or vomiting  Severe swelling in your hands, feet, and [name]    Home Blood Pressure Monitoring for Patients   Your provider has recommended that you check your blood pressure (BP) at least once a week at home. If you do not have a blood pressure cuff at home, one will be provided for you. Contact your provider if you have not received your monitor within 1 week.   Helpful Tips for Accurate Home Blood Pressure Checks  . Don't smoke, exercise, or drink caffeine 30 minutes before checking your BP . Use the restroom before checking your BP (a full  bladder can raise your pressure) . Relax in a comfortable upright chair . Feet on the ground . Left arm resting comfortably on a flat surface at the level of your heart . Legs uncrossed . Back supported . Sit quietly and don't talk . Place the cuff on your bare arm . Adjust snuggly, so that only two fingertips can fit between your skin and the top of the cuff . Check 2 readings separated by at least one minute . Keep a log of your BP readings . For a visual, please reference this diagram: http://ccnc.care/bpdiagram  Provider Name: [facility]     Phone: [phone]  Zone 1: ALL CLEAR  Continue to monitor your symptoms:  . BP reading is less than 140 (top number) or less than 90 (bottom number)  . No right upper stomach pain . No headaches or seeing spots . No feeling nauseated or throwing up . No swelling in [name] and hands  Zone 2: CAUTION Call your doctor's office for any of the following:  . BP reading is greater than 140 (top number) or greater than 90 (bottom number)  . Stomach pain under your ribs in the middle or right side . Headaches or seeing spots . Feeling nauseated or throwing up . Swelling in [name] and hands  Zone 3: EMERGENCY  Seek immediate medical care if you have any of  the following:  . BP reading is greater than160 (top number) or greater than 110 (bottom number) . Severe headaches not improving with Tylenol . Serious difficulty catching your breath . Any worsening symptoms from Zone 2   Braxton Hicks Contractions Contractions of the uterus can occur throughout pregnancy, but they are not always a sign that you are in labor. You may have practice contractions called Braxton Hicks contractions. These false labor contractions are sometimes confused with true labor. What are Braxton Hicks contractions? Braxton Hicks contractions are tightening movements that occur in the muscles of the uterus before labor. Unlike true labor contractions, these  contractions do not result in opening (dilation) and thinning of the cervix. Toward the end of pregnancy (32-34 weeks), Braxton Hicks contractions can happen more often and may become stronger. These contractions are sometimes difficult to tell apart from true labor because they can be very uncomfortable. You should not feel embarrassed if you go to the hospital with false labor. Sometimes, the only way to tell if you are in true labor is for your health care provider to look for changes in the cervix. The health care provider will do a physical exam and may monitor your contractions. If you are not in true labor, the exam should show that your cervix is not dilating and your water has not broken. If there are no other health problems associated with your pregnancy, it is completely safe for you to be sent home with false labor. You may continue to have Braxton Hicks contractions until you go into true labor. How to tell the difference between true labor and false labor True labor  Contractions last 30-70 seconds.  Contractions become very regular.  Discomfort is usually felt in the top of the uterus, and it spreads to the lower abdomen and low back.  Contractions do not go away with walking.  Contractions usually become more intense and increase in frequency.  The cervix dilates and gets thinner. False labor  Contractions are usually shorter and not as strong as true labor contractions.  Contractions are usually irregular.  Contractions are often felt in the front of the lower abdomen and in the groin.  Contractions may go away when you walk around or change positions while lying down.  Contractions get weaker and are shorter-lasting as time goes on.  The cervix usually does not dilate or become thin. Follow these instructions at home:  1. Take over-the-counter and prescription medicines only as told by your health care provider. 2. Keep up with your usual exercises and follow other  instructions from your health care provider. 3. Eat and drink lightly if you think you are going into labor. 4. If Braxton Hicks contractions are making you uncomfortable: ? Change your position from lying down or resting to walking, or change from walking to resting. ? Sit and rest in a tub of warm water. ? Drink enough fluid to keep your urine pale yellow. Dehydration may cause these contractions. ? Do slow and deep breathing several times an hour. 5. Keep all follow-up prenatal visits as told by your health care provider. This is important. Contact a health care provider if:  You have a fever.  You have continuous pain in your abdomen. Get help right away if:  Your contractions become stronger, more regular, and closer together.  You have fluid leaking or gushing from your vagina.  You pass blood-tinged mucus (bloody show).  You have bleeding from your vagina.  You have low back   pain that you never had before.  You feel your baby's head pushing down and causing pelvic pressure.  Your baby is not moving inside you as much as it used to. Summary  Contractions that occur before labor are called Braxton Hicks contractions, false labor, or practice contractions.  Braxton Hicks contractions are usually shorter, weaker, farther apart, and less regular than true labor contractions. True labor contractions usually become progressively stronger and regular, and they become more frequent.  Manage discomfort from [facility] contractions by changing position, resting in a warm bath, drinking plenty of water, or practicing deep breathing. This information is not intended to replace advice given to you by your health care provider. Make sure you discuss any questions you have with your health care provider. Document Revised: 12/05/2016 Document Reviewed: 05/08/2016 Elsevier Patient Education  [place].

## 2019-10-14 ENCOUNTER — Encounter: Payer: Self-pay | Admitting: *Deleted

## 2019-10-14 ENCOUNTER — Encounter: Payer: Self-pay | Admitting: Advanced Practice Midwife

## 2019-10-14 ENCOUNTER — Ambulatory Visit (INDEPENDENT_AMBULATORY_CARE_PROVIDER_SITE_OTHER): Payer: 59 | Admitting: Advanced Practice Midwife

## 2019-10-14 VITALS — BP 129/74 | HR 91 | Wt 172.0 lb

## 2019-10-14 DIAGNOSIS — D563 Thalassemia minor: Secondary | ICD-10-CM | POA: Diagnosis not present

## 2019-10-14 DIAGNOSIS — Z331 Pregnant state, incidental: Secondary | ICD-10-CM

## 2019-10-14 DIAGNOSIS — Z3A39 39 weeks gestation of pregnancy: Secondary | ICD-10-CM | POA: Diagnosis not present

## 2019-10-14 DIAGNOSIS — Z1389 Encounter for screening for other disorder: Secondary | ICD-10-CM | POA: Diagnosis not present

## 2019-10-14 DIAGNOSIS — Z3480 Encounter for supervision of other normal pregnancy, unspecified trimester: Secondary | ICD-10-CM

## 2019-10-14 LAB — POCT URINALYSIS DIPSTICK OB
Blood, UA: NEGATIVE
Glucose, UA: NEGATIVE
Ketones, UA: NEGATIVE
Leukocytes, UA: NEGATIVE
Nitrite, UA: NEGATIVE
POC,PROTEIN,UA: NEGATIVE

## 2019-10-14 NOTE — Patient Instructions (Signed)
Braxton Hicks Contractions °Contractions of the uterus can occur throughout pregnancy, but they are not always a sign that you are in labor. You may have practice contractions called Braxton Hicks contractions. These false labor contractions are sometimes confused with true labor. °What are Braxton Hicks contractions? °Braxton Hicks contractions are tightening movements that occur in the muscles of the uterus before labor. Unlike true labor contractions, these contractions do not result in opening (dilation) and thinning of the cervix. Toward the end of pregnancy (32-34 weeks), Braxton Hicks contractions can happen more often and may become stronger. These contractions are sometimes difficult to tell apart from true labor because they can be very uncomfortable. You should not feel embarrassed if you go to the hospital with false labor. °Sometimes, the only way to tell if you are in true labor is for your health care provider to look for changes in the cervix. The health care provider will do a physical exam and may monitor your contractions. If you are not in true labor, the exam should show that your cervix is not dilating and your water has not broken. °If there are no other health problems associated with your pregnancy, it is completely safe for you to be sent home with false labor. You may continue to have Braxton Hicks contractions until you go into true labor. °How to tell the difference between true labor and false labor °True labor °· Contractions last 30-70 seconds. °· Contractions become very regular. °· Discomfort is usually felt in the top of the uterus, and it spreads to the lower abdomen and low back. °· Contractions do not go away with walking. °· Contractions usually become more intense and increase in frequency. °· The cervix dilates and gets thinner. °False labor °· Contractions are usually shorter and not as strong as true labor contractions. °· Contractions are usually irregular. °· Contractions  are often felt in the front of the lower abdomen and in the groin. °· Contractions may go away when you walk around or change positions while lying down. °· Contractions get weaker and are shorter-lasting as time goes on. °· The cervix usually does not dilate or become thin. °Follow these instructions at home: ° °· Take over-the-counter and prescription medicines only as told by your health care provider. °· Keep up with your usual exercises and follow other instructions from your health care provider. °· Eat and drink lightly if you think you are going into labor. °· If Braxton Hicks contractions are making you uncomfortable: °? Change your position from lying down or resting to walking, or change from walking to resting. °? Sit and rest in a tub of warm water. °? Drink enough fluid to keep your urine pale yellow. Dehydration may cause these contractions. °? Do slow and deep breathing several times an hour. °· Keep all follow-up prenatal visits as told by your health care provider. This is important. °Contact a health care provider if: °· You have a fever. °· You have continuous pain in your abdomen. °Get help right away if: °· Your contractions become stronger, more regular, and closer together. °· You have fluid leaking or gushing from your vagina. °· You pass blood-tinged mucus (bloody show). °· You have bleeding from your vagina. °· You have low back pain that you never had before. °· You feel your baby’s head pushing down and causing pelvic pressure. °· Your baby is not moving inside you as much as it used to. °Summary °· Contractions that occur before labor are   called Braxton Hicks contractions, false labor, or practice contractions. °· Braxton Hicks contractions are usually shorter, weaker, farther apart, and less regular than true labor contractions. True labor contractions usually become progressively stronger and regular, and they become more frequent. °· Manage discomfort from Braxton Hicks contractions  by changing position, resting in a warm bath, drinking plenty of water, or practicing deep breathing. °This information is not intended to replace advice given to you by your health care provider. Make sure you discuss any questions you have with your health care provider. °Document Revised: 12/05/2016 Document Reviewed: 05/08/2016 °Elsevier Patient Education © 2020 Elsevier Inc[employer] ° °

## 2019-10-14 NOTE — Progress Notes (Signed)
   LOW-RISK PREGNANCY VISIT Patient name: Emily [name] MRN [id number]  Date of birth: [birth year] Chief Complaint:   Routine Prenatal Visit  History of Present Illness:   Emily [name] is a 24 y.o. G15P0010 female at [redacted]w[redacted]d with an Estimated Date of Delivery: 10/20/19 being seen today for ongoing management of a low-risk pregnancy.  Today she reports no complaints. Contractions: Irregular. Vag. Bleeding: None.  Movement: Present. denies leaking of fluid. Review of Systems:   Pertinent items are noted in HPI Denies abnormal vaginal discharge w/ itching/odor/irritation, headaches, visual changes, shortness of breath, chest pain, abdominal pain, severe nausea/vomiting, or problems with urination or bowel movements unless otherwise stated above. Pertinent History Reviewed:  Reviewed past medical,surgical, social, obstetrical and family history.  Reviewed problem list, medications and allergies. Physical Assessment:   Vitals:   10/14/19 1007  BP: 129/74  Pulse: 91  Weight: 172 lb (78 kg)  Body mass index is 26.15 kg/m.        Physical Examination:   General appearance: Well appearing, and in no distress  Mental status: Alert, oriented to person, place, and time  Skin: Warm & dry  Cardiovascular: Normal heart rate noted  Respiratory: Normal respiratory effort, no distress  Abdomen: Soft, gravid, nontender  Pelvic: Cervical exam performed  Dilation: 1.5 Effacement (%): 70 Station: -2  Extremities: Edema: Trace  Fetal Status: Fetal Heart Rate (bpm): 135 Fundal Height: 39 cm Movement: Present Presentation: Vertex  Results for orders placed or performed in visit on 10/14/19 (from the past 24 hour(s))  POC Urinalysis Dipstick OB   Collection Time: 10/14/19 10:09 AM  Result Value Ref Range   Color, UA     Clarity, UA     Glucose, UA Negative Negative   Bilirubin, UA     Ketones, UA neg    Spec Grav, UA     Blood, UA neg    pH, UA     POC,PROTEIN,UA Negative Negative, Trace, Small  (1+), Moderate (2+), Large (3+), 4+   Urobilinogen, UA     Nitrite, UA neg    Leukocytes, UA Negative Negative   Appearance     Odor      Assessment & Plan:  1) Low-risk pregnancy G2P0010 at [redacted]w[redacted]d with an Estimated Date of Delivery: 10/20/19   2) Term with semi-favorable cx, membranes swept per pt request  3) + Alpha thal, FOB test back and is negative   Meds: No orders of the defined types were placed in this encounter.  Labs/procedures today: membrane sweep  Plan:  Continue routine obstetrical care with plan for NST at 40.1wks and IOL for 41.0wks  Reviewed: Term labor symptoms and general obstetric precautions including but not limited to vaginal bleeding, contractions, leaking of fluid and fetal movement were reviewed in detail with the patient.  All questions were answered. Has home bp cuff. Check bp weekly, let us know if >140/90.   Follow-up: Return in 1 week (on 10/21/2019) for NST, LROB, in person.  Orders Placed This Encounter  Procedures  . POC Urinalysis Dipstick OB   Arabella Merles [facility] 10/14/2019 10:43 AM

## 2019-10-16 ENCOUNTER — Other Ambulatory Visit: Payer: Self-pay

## 2019-10-16 ENCOUNTER — Inpatient Hospital Stay (HOSPITAL_COMMUNITY)
Admission: AD | Admit: 2019-10-16 | Discharge: 2019-10-16 | Disposition: A | Discharge status: Home / Self Care | Payer: 59 | Attending: Obstetrics & Gynecology | Admitting: Obstetrics & Gynecology

## 2019-10-16 ENCOUNTER — Encounter (HOSPITAL_COMMUNITY): Payer: Self-pay | Admitting: Obstetrics & Gynecology

## 2019-10-16 DIAGNOSIS — N898 Other specified noninflammatory disorders of vagina: Secondary | ICD-10-CM

## 2019-10-16 DIAGNOSIS — O321XX0 Maternal care for breech presentation, not applicable or unspecified: Secondary | ICD-10-CM | POA: Diagnosis not present

## 2019-10-16 DIAGNOSIS — O4693 Antepartum hemorrhage, unspecified, third trimester: Secondary | ICD-10-CM | POA: Diagnosis not present

## 2019-10-16 DIAGNOSIS — Z3A39 39 weeks gestation of pregnancy: Secondary | ICD-10-CM | POA: Diagnosis not present

## 2019-10-16 DIAGNOSIS — O471 False labor at or after 37 completed weeks of gestation: Secondary | ICD-10-CM | POA: Diagnosis present

## 2019-10-16 DIAGNOSIS — Z3483 Encounter for supervision of other normal pregnancy, third trimester: Secondary | ICD-10-CM | POA: Diagnosis not present

## 2019-10-16 DIAGNOSIS — Z23 Encounter for immunization: Secondary | ICD-10-CM | POA: Diagnosis not present

## 2019-10-16 DIAGNOSIS — Z3A40 40 weeks gestation of pregnancy: Secondary | ICD-10-CM | POA: Diagnosis not present

## 2019-10-16 DIAGNOSIS — Z20822 Contact with and (suspected) exposure to covid-19: Secondary | ICD-10-CM | POA: Diagnosis not present

## 2019-10-16 DIAGNOSIS — O4703 False labor before 37 completed weeks of gestation, third trimester: Secondary | ICD-10-CM

## 2019-10-16 LAB — POCT FERN TEST: POCT Fern Test: NEGATIVE

## 2019-10-16 NOTE — MAU Note (Signed)
Amnisure collected but discarded due to vaginal bleeding as it is a testing contraindication. [place]ra CNM in dept and made aware

## 2019-10-16 NOTE — MAU Note (Signed)
Emily [name] is a 24 y.o. at [redacted]w[redacted]d here in MAU reporting:  +contractions 2-5 min   +LOF clear Has a pad on "just in case"   Onset of complaint: contractions started at 6am; LOF started at 645am  Endorses her last OB visit was on Friday. VE was 1.5 cm and had her membranes swept.  Pain score: 4/10 Vitals:   10/16/19 0842  BP: 132/70  Pulse: 81  Resp: 18  Temp: 98.2 F (36.8 C)  SpO2: 99%     FHT:124 Lab orders placed from triage: mau labor triage order set

## 2019-10-16 NOTE — [facility] Provider Note (Signed)
First Provider Initiated Contact with Patient 10/16/19 1114       S: Ms. Emily [name] is a 23 y.o. G2P0010 at [redacted]w[redacted]d  who presents to [facility] today complaining of leaking of fluid since 645am. She endorses vaginal spotting. She endorses contractions. She reports normal fetal movement.    O: BP 124/77 (BP Location: Right Arm)   Pulse 81   Temp 98.2 F (36.8 C) (Oral)   Resp 18   LMP  (LMP Unknown)   SpO2 98% Comment: ra GENERAL: Well-developed, well-nourished female in no acute distress.  HEAD: Normocephalic, atraumatic.  CHEST: Normal effort of breathing, regular heart rate ABDOMEN: Soft, nontender, gravid PELVIC: Normal external female genitalia. Vagina is pink and rugated. Cervix with normal contour, no lesions. Normal discharge. Bloody mucous in the vagina but, after removal with swab and observation, no pooling was observed. Unable to collect amnisure due to continued oozing of bloody mucous.   Cervical exam:  Dilation: 3 Effacement (%): 50 Cervical Position: Middle Station: -2 Presentation: Vertex Exam by:: Luna Kitchens CNM   Fetal Monitoring: Baseline: 130 Variability: mod Accelerations: present Decelerations: neg Contractions: q 2-3  Results for orders placed or performed during the hospital encounter of 10/16/19 (from the past 24 hour(s))  POCT fern test     Status: None   Collection Time: 10/16/19  9:15 AM  Result Value Ref Range   POCT Fern Test Negative = intact amniotic membranes      A: [facility] at [redacted]w[redacted]d  Keep OB appt  On 10/15 at [facility]. 3rd trimester precautions reviewed; patient and partner agree with plan of care.  P: Discharge home with labor contractions.   Marylene Land, CNM 10/16/2019 3:23 PM

## 2019-10-16 NOTE — Discharge Instructions (Signed)
Fetal Movement Counts Patient Name: ________________________________________________ Patient Due Date: ____________________ What is a fetal movement count?  A fetal movement count is the number of times that you feel your baby move during a certain amount of time. This may also be called a fetal kick count. A fetal movement count is recommended for every pregnant woman. You may be asked to start counting fetal movements as early as week 28 of your pregnancy. Pay attention to when your baby is most active. You may notice your baby's sleep and wake cycles. You may also notice things that make your baby move more. You should do a fetal movement count:  When your baby is normally most active.  At the same time each day. A good time to count movements is while you are resting, after having something to eat and drink. How do I count fetal movements? 1. Find a quiet, comfortable area. Sit, or lie down on your side. 2. Write down the date, the start time and stop time, and the number of movements that you felt between those two times. Take this information with you to your health care visits. 3. Write down your start time when you feel the first movement. 4. Count kicks, flutters, swishes, rolls, and jabs. You should feel at least 10 movements. 5. You may stop counting after you have felt 10 movements, or if you have been counting for 2 hours. Write down the stop time. 6. If you do not feel 10 movements in 2 hours, contact your health care provider for further instructions. Your health care provider may want to do additional tests to assess your baby's well-being. Contact a health care provider if:  You feel fewer than 10 movements in 2 hours.  Your baby is not moving like he or she usually does. Date: ____________ Start time: ____________ Stop time: ____________ Movements: ____________ Date: ____________ Start time: ____________ Stop time: ____________ Movements: ____________ Date: ____________  Start time: ____________ Stop time: ____________ Movements: ____________ Date: ____________ Start time: ____________ Stop time: ____________ Movements: ____________ Date: ____________ Start time: ____________ Stop time: ____________ Movements: ____________ Date: ____________ Start time: ____________ Stop time: ____________ Movements: ____________ Date: ____________ Start time: ____________ Stop time: ____________ Movements: ____________ Date: ____________ Start time: ____________ Stop time: ____________ Movements: ____________ Date: ____________ Start time: ____________ Stop time: ____________ Movements: ____________ This information is not intended to replace advice given to you by your health care provider. Make sure you discuss any questions you have with your health care provider. Document Revised: 08/12/2018 Document Reviewed: 08/12/2018 Elsevier Patient Education  2020 [employer] Braxton Hicks Contractions Contractions of the uterus can occur throughout pregnancy, but they are not always a sign that you are in labor. You may have practice contractions called Braxton Hicks contractions. These false labor contractions are sometimes confused with true labor. What are Braxton Hicks contractions? Braxton Hicks contractions are tightening movements that occur in the muscles of the uterus before labor. Unlike true labor contractions, these contractions do not result in opening (dilation) and thinning of the cervix. Toward the end of pregnancy (32-34 weeks), Braxton Hicks contractions can happen more often and may become stronger. These contractions are sometimes difficult to tell apart from true labor because they can be very uncomfortable. You should not feel embarrassed if you go to the hospital with false labor. Sometimes, the only way to tell if you are in true labor is for your health care provider to look for changes in the cervix. The health care provider   will do a physical exam and may  monitor your contractions. If you are not in true labor, the exam should show that your cervix is not dilating and your water has not broken. If there are no other health problems associated with your pregnancy, it is completely safe for you to be sent home with false labor. You may continue to have Braxton Hicks contractions until you go into true labor. How to tell the difference between true labor and false labor True labor  Contractions last 30-70 seconds.  Contractions become very regular.  Discomfort is usually felt in the top of the uterus, and it spreads to the lower abdomen and low back.  Contractions do not go away with walking.  Contractions usually become more intense and increase in frequency.  The cervix dilates and gets thinner. False labor  Contractions are usually shorter and not as strong as true labor contractions.  Contractions are usually irregular.  Contractions are often felt in the front of the lower abdomen and in the groin.  Contractions may go away when you walk around or change positions while lying down.  Contractions get weaker and are shorter-lasting as time goes on.  The cervix usually does not dilate or become thin. Follow these instructions at home:   Take over-the-counter and prescription medicines only as told by your health care provider.  Keep up with your usual exercises and follow other instructions from your health care provider.  Eat and drink lightly if you think you are going into labor.  If Braxton Hicks contractions are making you uncomfortable: ? Change your position from lying down or resting to walking, or change from walking to resting. ? Sit and rest in a tub of warm water. ? Drink enough fluid to keep your urine pale yellow. Dehydration may cause these contractions. ? Do slow and deep breathing several times an hour.  Keep all follow-up prenatal visits as told by your health care provider. This is important. Contact a  health care provider if:  You have a fever.  You have continuous pain in your abdomen. Get help right away if:  Your contractions become stronger, more regular, and closer together.  You have fluid leaking or gushing from your vagina.  You pass blood-tinged mucus (bloody show).  You have bleeding from your vagina.  You have low back pain that you never had before.  You feel your baby's head pushing down and causing pelvic pressure.  Your baby is not moving inside you as much as it used to. Summary  Contractions that occur before labor are called Braxton Hicks contractions, false labor, or practice contractions.  Braxton Hicks contractions are usually shorter, weaker, farther apart, and less regular than true labor contractions. True labor contractions usually become progressively stronger and regular, and they become more frequent.  Manage discomfort from Braxton Hicks contractions by changing position, resting in a warm bath, drinking plenty of water, or practicing deep breathing. This information is not intended to replace advice given to you by your health care provider. Make sure you discuss any questions you have with your health care provider. Document Revised: 12/05/2016 Document Reviewed: 05/08/2016 Elsevier Patient Education  2020 [employer] Inc[employer]  

## 2019-10-21 ENCOUNTER — Other Ambulatory Visit: Payer: 59 | Admitting: Women's Health

## 2019-11-02 ENCOUNTER — Encounter: Payer: Self-pay | Admitting: *Deleted

## 2019-11-17 ENCOUNTER — Other Ambulatory Visit: Payer: Self-pay

## 2019-11-17 ENCOUNTER — Emergency Department (HOSPITAL_COMMUNITY)
Admission: EM | Admit: 2019-11-17 | Discharge: 2019-11-17 | Disposition: A | Discharge status: Home / Self Care | Payer: 59 | Attending: Emergency Medicine | Admitting: Emergency Medicine

## 2019-11-17 ENCOUNTER — Encounter (HOSPITAL_COMMUNITY): Payer: Self-pay | Admitting: *Deleted

## 2019-11-17 DIAGNOSIS — R519 Headache, unspecified: Secondary | ICD-10-CM | POA: Diagnosis not present

## 2019-11-17 DIAGNOSIS — Z20822 Contact with and (suspected) exposure to covid-19: Secondary | ICD-10-CM | POA: Diagnosis not present

## 2019-11-17 DIAGNOSIS — J45909 Unspecified asthma, uncomplicated: Secondary | ICD-10-CM | POA: Diagnosis not present

## 2019-11-17 DIAGNOSIS — J029 Acute pharyngitis, unspecified: Secondary | ICD-10-CM | POA: Insufficient documentation

## 2019-11-17 DIAGNOSIS — Z7982 Long term (current) use of aspirin: Secondary | ICD-10-CM | POA: Insufficient documentation

## 2019-11-17 LAB — RESPIRATORY PANEL BY RT PCR (FLU A&B, COVID)
Influenza A by PCR: NEGATIVE
Influenza B by PCR: NEGATIVE
SARS Coronavirus 2 by RT PCR: NEGATIVE

## 2019-11-17 LAB — GROUP A STREP BY PCR: Group A Strep by PCR: NOT DETECTED

## 2019-11-17 MED ORDER — IBUPROFEN 400 MG PO TABS
600.0000 mg | ORAL_TABLET | Freq: Once | ORAL | Status: AC
  Administered 2019-11-17: 600 mg via ORAL
  Filled 2019-11-17: qty 2

## 2019-11-17 NOTE — ED Provider Notes (Signed)
[facility] EMERGENCY DEPARTMENT Provider Note   CSN: [id number] Arrival date & time: 11/17/19  1400     History Chief Complaint  Patient presents with  . Headache    Emily [name] is a 24 y.o. female who presents with 1 day of sore throat, mild headache.  She states she was feeling fine yesterday, but woke up with mild sore throat and gradual onset of frontal headache this morning.  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 She denies chest pain, shortness of breath, palpitations, abdominal pain, nausea, vomiting, diarrhea.  Denies congestion or runny nose.  Denies fevers or chills at home this morning. She has not taken any medication to relieve her sore throat or headache. She has received 2 doses of vaccination against COVID-19 (second dose 03/2019).  She states she is 1 month postpartum with a baby boy.  She is currently breast-feeding.  She has not had any postpartum complications.  I personally reviewed this patient's medical records.  She has history of asthma, alpha thalassemia trait.  She is not on any medications every day.  HPI     Past Medical History:  Diagnosis Date  . Asthma    as a child  . Environmental allergies   . Miscarriage     Patient Active Problem List   Diagnosis Date Noted  . Alpha thalassemia trait 05/06/2019    Past Surgical History:  Procedure Laterality Date  . NO PAST SURGERIES       OB History    Gravida  2   Para  0   Term  0   Preterm  0   AB  1   Living  0     SAB  1   TAB  0   Ectopic  0   Multiple  0   Live Births  0           Family History  Problem Relation Age of Onset  . Hypertension Father   . Heart attack Paternal Grandfather   . Diabetes Paternal Grandmother   . Diabetes Maternal Grandmother     Social History   Tobacco Use  . Smoking status: Never Smoker  . Smokeless tobacco: Never Used  Vaping Use  .  Vaping Use: Never used  Substance Use Topics  . Alcohol use: No  . Drug use: No    Home Medications Prior to Admission medications   Medication Sig Start Date End Date Taking? Authorizing Provider  acetaminophen (TYLENOL) 500 MG tablet Take 500 mg by mouth every 6 (six) hours as needed.    [provider]  aspirin EC 81 MG tablet Take 81 mg by mouth daily.    [provider]  cetirizine (ZYRTEC) 10 MG tablet     [provider]  Prenatal Vit-Fe Fumarate-FA (PRENATAL MULTIVITAMIN) TABS tablet Take 1 tablet by mouth daily at 12 noon.    [provider]    Allergies    Patient has no known allergies.  Review of Systems   Review of Systems  Constitutional: Negative for activity change, appetite change, chills, diaphoresis, fatigue and fever.  HENT: Positive for sore throat. Negative for congestion, ear pain, mouth sores, rhinorrhea, sinus pressure, sinus pain, sneezing, trouble swallowing and voice change.   Respiratory: Negative for cough, chest tightness, shortness of breath and wheezing.   Cardiovascular: Negative for chest pain, palpitations and leg swelling.  Gastrointestinal: Negative for abdominal pain,  diarrhea, nausea and vomiting.  Endocrine: Negative.   Genitourinary: Negative for dysuria, frequency, hematuria and urgency.  Musculoskeletal: Negative.   Skin: Negative.   Allergic/Immunologic: Negative.   Neurological: Positive for headaches. Negative for dizziness, syncope, weakness and light-headedness.  Hematological: Negative.     Physical Exam Updated Vital Signs BP 123/81 (BP Location: Right Arm)   Pulse 98   Temp 100 F (37.8 C) (Oral)   Resp 14   Ht 5\' 7"  (1.702 m)   Wt 64.8 kg   SpO2 100%   BMI 22.37 kg/m   Physical Exam Vitals and nursing note reviewed.  HENT:     Head: Normocephalic and atraumatic.     Nose: Nose normal.     Mouth/Throat:     Mouth: Mucous membranes are moist.     Dentition: Normal dentition.      Tongue: No lesions.     Pharynx: Oropharynx is clear. Uvula midline. Posterior oropharyngeal erythema present. No pharyngeal swelling, oropharyngeal exudate or uvula swelling.     Tonsils: No tonsillar exudate or tonsillar abscesses.  Eyes:     General:        Right eye: No discharge.        Left eye: No discharge.     Extraocular Movements: Extraocular movements intact.     Conjunctiva/sclera: Conjunctivae normal.     Pupils: Pupils are equal, round, and reactive to light.     Visual Fields: Right eye visual fields normal and left eye visual fields normal.  Neck:     Trachea: Trachea and phonation normal.     Comments: Shotty anterior superficial cervical lymphadenopathy bilaterally. No submental or sublingual tenderness to palpation. Cardiovascular:     Rate and Rhythm: Normal rate and regular rhythm.     Pulses: Normal pulses.          Radial pulses are 2+ on the right side and 2+ on the left side.       Dorsalis pedis pulses are 2+ on the right side and 2+ on the left side.     Heart sounds: Murmur heard.  Systolic murmur is present with a grade of 1/6.   Pulmonary:     Effort: Pulmonary effort is normal. No respiratory distress.     Breath sounds: Normal breath sounds. No wheezing or rales.  Chest:     Chest wall: No tenderness or crepitus.  Abdominal:     General: There is no distension.     Palpations: Abdomen is soft.     Tenderness: There is no abdominal tenderness. There is no guarding or rebound.  Musculoskeletal:        General: No deformity.     Cervical back: Normal range of motion and neck supple. No rigidity or crepitus. No pain with movement, spinous process tenderness or muscular tenderness.     Right lower leg: No edema.     Left lower leg: No edema.  Lymphadenopathy:     Cervical: Cervical adenopathy present.     Right cervical: Superficial cervical adenopathy present.     Left cervical: Superficial cervical adenopathy present.  Skin:    General: Skin  is warm and dry.     Capillary Refill: Capillary refill takes less than 2 seconds.  Neurological:     General: No focal deficit present.     Mental Status: She is alert and oriented to person, place, and time.  Psychiatric:        Mood and Affect: Mood normal.  ED Results / Procedures / Treatments   Labs (all labs ordered are listed, but only abnormal results are displayed) Labs Reviewed  GROUP A STREP BY PCR  RESPIRATORY PANEL BY RT PCR (FLU A&B, COVID)    EKG None  Radiology No results found.  Procedures Procedures (including critical care time)  Medications Ordered in ED Medications  ibuprofen (ADVIL) tablet 600 mg (has no administration in time range)    ED Course  I have reviewed the triage vital signs and the nursing notes.  Pertinent labs & imaging results that were available during my care of the patient were reviewed by me and considered in my medical decision making (see chart for details).    MDM Rules/Calculators/A&P                         23 year old female who presents with 1 day of sore throat and headache.  History of asthma, patient is vaccinated COVID-19 x2.   Vital signs are normal on intake.  Patient borderline febrile at 100 F.  Physical exam very reassuring, cardiopulmonary exam normal.  Mild posterior pharyngeal erythema, without exudate or swelling.  Shotty anterior cervical lymphadenopathy bilaterally.  Group A strep, respiratory pathogen panel ordered in triage, pending.  Ibuprofen offered.  Patient negative for group A strep.  Patient negative for COVID-19, influenza A/B.  I do feel any further work up is necessary in the emergency department at this time.  This patient has viral pharyngitis.  She may continue to take over-the-counter analgesia as necessary.  Encouraged hydration and rest. Emily [name] voiced understanding of medical evaluation and treatment plan.  Each of her questions were answered to her expressed satisfaction.   Return precautions were given.  Patient is stable for discharge.  Emily [name] was evaluated in Emergency Department on 11/17/2019 for the symptoms described in the history of present illness. She was evaluated in the context of the global COVID-19 pandemic, which necessitated consideration that the patient might be at risk for infection with the SARS-CoV-2 virus that causes COVID-19. Institutional protocols and algorithms that pertain to the evaluation of patients at risk for COVID-19 are in a state of rapid change based on information released by regulatory bodies including the CDC and federal and state organizations. These policies and algorithms were followed during the patient's care in the ED.  Final Clinical Impression(s) / ED Diagnoses Final diagnoses:  Sore throat    Rx / DC Orders ED Discharge Orders    None       Sherrilee Gilles 11/17/19 1546    Blane Ohara, MD 11/18/19 2357

## 2019-11-17 NOTE — ED Triage Notes (Signed)
Headache, fever and sore throat onset this am

## 2019-11-17 NOTE — Discharge Instructions (Addendum)
You were evaluated in the emergency department today for sore throat and headache.  Your vital signs and physical exam are very reassuring. Today you tested negative for strep throat,  COVID-19, and influenza A/B. This is good news!  It is likely that you have a viral pharyngitis, which is a virus that causes sore throat.  You may take over-the-counter Tylenol or ibuprofen as needed for your pain at home.  I encourage you to get plenty of hydration and rest over the next few days. Should you develop a fever, it will be important that you do not return to school until you have been fever free for 24 hours without the assistance of Tylenol or ibuprofen.  Please return to the emergency department if you develop chest pain, difficulty breathing, nausea and vomiting that does not stop, or other new severe symptoms.

## 2019-11-21 ENCOUNTER — Encounter: Payer: Self-pay | Admitting: Women's Health

## 2019-11-21 ENCOUNTER — Ambulatory Visit (INDEPENDENT_AMBULATORY_CARE_PROVIDER_SITE_OTHER): Payer: 59 | Admitting: Women's Health

## 2019-11-21 ENCOUNTER — Other Ambulatory Visit: Payer: Self-pay

## 2019-11-21 DIAGNOSIS — Z3483 Encounter for supervision of other normal pregnancy, third trimester: Secondary | ICD-10-CM | POA: Diagnosis not present

## 2019-11-21 DIAGNOSIS — Z3482 Encounter for supervision of other normal pregnancy, second trimester: Secondary | ICD-10-CM | POA: Diagnosis not present

## 2019-11-21 DIAGNOSIS — Z30011 Encounter for initial prescription of contraceptive pills: Secondary | ICD-10-CM | POA: Diagnosis not present

## 2019-11-21 MED ORDER — NORETHINDRONE 0.35 MG PO TABS: 1.0000 | ORAL_TABLET | Freq: Every day | ORAL | 11 refills | Status: DC

## 2019-11-21 NOTE — Progress Notes (Signed)
POSTPARTUM VISIT Patient name: Emily [name] MRN [id number]  Date of birth: November 04, 1995 Chief Complaint:   Postpartum Care  History of Present Illness:   Emily [name] is a 24 y.o. G69P1011 African American female being seen today for a postpartum visit. She is 5 weeks postpartum following a spontaneous vaginal delivery at 39.3 gestational weeks at [facility]. Went to [facility] earlier and was 3cm. Was 6cm by time she got to [facility]. IOL: No, for n/a. Anesthesia: local for repair.  Laceration: 2nd degree.  Complications: none. Inpatient contraception: no.   Pregnancy uncomplicated. Tobacco use: no. Substance use disorder: no. Last pap smear: 05/17/19 and results were normal. Next pap smear due: 2024 No LMP recorded.  Postpartum course has been uncomplicated. Bleeding spotting. Bowel function is normal. Bladder function is normal. Urinary incontinence? No, fecal incontinence? No Patient is not sexually active. Last sexual activity: prior to birth of baby.  Desired contraception: POPs. Patient does want a pregnancy in the future.  Desired family size is 2-3 children.   Upstream - 11/21/19 1038      Pregnancy Intention Screening   Does the patient want to become pregnant in the next year? No    Does the patient's partner want to become pregnant in the next year? No    Would the patient like to discuss contraceptive options today? No      Contraception Wrap Up   Current Method Abstinence    Contraception Counseling Provided Yes          The pregnancy intention screening data noted above was reviewed. Potential methods of contraception were discussed. The patient elected to proceed with Oral Contraceptive.   Edinburgh Postpartum Depression Screening: negative  Edinburgh Postnatal Depression Scale - 11/21/19 1039      Edinburgh Postnatal Depression Scale:  In the Past 7 Days   I have been able to laugh and see the funny side of things. 0    I have looked forward with enjoyment to things. 0    I  have blamed myself unnecessarily when things went wrong. 2    I have been anxious or worried for no good reason. 0    I have felt scared or panicky for no good reason. 2    Things have been getting on top of me. 0    I have been so unhappy that I have had difficulty sleeping. 0    I have felt sad or miserable. 1    I have been so unhappy that I have been crying. 0    The thought of harming myself has occurred to me. 0    Edinburgh Postnatal Depression Scale Total 5          Baby's course has been uncomplicated. Baby is feeding by breast: milk supply adequate. Infant has a pediatrician/family doctor? Yes.  Childcare strategy if returning to work/school: mom.  Pt has material needs met for her and baby: Yes.   Review of Systems:   Pertinent items are noted in HPI Denies Abnormal vaginal discharge w/ itching/odor/irritation, headaches, visual changes, shortness of breath, chest pain, abdominal pain, severe nausea/vomiting, or problems with urination or bowel movements. Pertinent History Reviewed:  Reviewed past medical,surgical, obstetrical and family history.  Reviewed problem list, medications and allergies. OB History  Gravida Para Term Preterm AB Living  2 1 1  0 1 1  SAB TAB Ectopic Multiple Live Births  1 0 0 0 1    # Outcome Date GA Lbr Len/2nd Weight  Sex Delivery Anes PTL Lv  2 Term 10/16/19 [redacted]w[redacted]d 7 lb 11 oz (3.487 kg) M Vag-Spont  N LIV  1 SAB 01/2019           Physical Assessment:   Vitals:   11/21/19 1037  BP: 115/65  Pulse: (!) 44  There is no height or weight on file to calculate BMI.       Physical Examination:   General appearance: alert, well appearing, and in no distress  Mental status: alert, oriented to person, place, and time  Skin: warm & dry   Cardiovascular: normal heart rate noted   Respiratory: normal respiratory effort, no distress   Breasts: deferred, no complaints   Abdomen: soft, non-tender   Pelvic: VULVA: lac healing well  Rectal: no  hemorrhoids  Extremities: no edema  Chaperone: Angel Neas         No results found for this or any previous visit (from the past 24 hour(s)).  Assessment & Plan:  1) Postpartum exam 2) 5 wks s/p spontaneous vaginal delivery 3) breast feeding 4) Depression screening 5) Contraception management- Rx micronor w/ 11RF, understands has to take at exact same time daily to be effective, if late taking use condom as back-up   Essential components of care per ACOG recommendations:  1.  Mood and well being:  . If positive depression screen, discussed and plan developed.  . If using tobacco we discussed reduction/cessation and risk of relapse . If current substance abuse, we discussed and referral to local resources was offered.   2. Infant care and feeding:  . If breastfeeding, discussed returning to work, pumping, breastfeeding-associated pain, guidance regarding return to fertility while lactating if not using another method. If needed, patient was provided with a letter to be allowed to pump q 2-3hrs to support lactation in a private location with access to a refrigerator to store breastmilk.   . Recommended that all caregivers be immunized for flu, pertussis and other preventable communicable diseases . If pt does not have material needs met for her/baby, referred to local resources for help obtaining these.  3. Sexuality, contraception and birth spacing . Provided guidance regarding sexuality, management of dyspareunia, and resumption of intercourse . Discussed avoiding interpregnancy interval <641ms and recommended birth spacing of 18 months  4. Sleep and fatigue . Discussed coping options for fatigue and sleep disruption . Encouraged family/partner/community support of 4 hrs of uninterrupted sleep to help with mood and fatigue  5. Physical recovery  . If pt had a C/S, assessed incisional pain and providing guidance on normal vs prolonged recovery . If pt had a laceration, perineal  healing and pain reviewed.  . If urinary or fecal incontinence, discussed management and referred to PT or uro/gyn if indicated  . Patient is safe to resume physical activity. Discussed attainment of healthy weight.  6.  Chronic disease management . Discussed pregnancy complications if any, and their implications for future childbearing and long-term maternal health. . Review recommendations for prevention of recurrent pregnancy complications, such as 17 hydroxyprogesterone caproate to reduce risk for recurrent PTB not applicable, or aspirin to reduce risk of preeclampsia not applicable. . Pt had GDM: No. If yes, 2hr GTT scheduled: not applicable. . Reviewed medications and non-pregnant dosing including consideration of whether pt is breastfeeding using a reliable resource such as LactMed: not applicable . Referred for f/u w/ PCP or subspecialist providers as indicated: not applicable  7. Health maintenance . Mammogram at 4045yor earlier if indicated .  Pap smears as indicated  Meds:  Meds ordered this encounter  Medications  . norethindrone (MICRONOR) 0.35 MG tablet    Sig: Take 1 tablet (0.35 mg total) by mouth daily.    Dispense:  28 tablet    Refill:  11    Order Specific Question:   Supervising Provider    Answer:   Florian Buff [[id number]]    Follow-up: Return in about 1 year (around 11/20/2020) for Physical.   No orders of the defined types were placed in this encounter.   Roma Schanz CNM, [facility] 11/21/2019 11:19 AM

## 2020-01-16 DIAGNOSIS — Z3482 Encounter for supervision of other normal pregnancy, second trimester: Secondary | ICD-10-CM | POA: Diagnosis not present

## 2020-01-16 DIAGNOSIS — Z3483 Encounter for supervision of other normal pregnancy, third trimester: Secondary | ICD-10-CM | POA: Diagnosis not present

## 2020-01-31 ENCOUNTER — Other Ambulatory Visit: Payer: 59

## 2020-02-10 DIAGNOSIS — Z111 Encounter for screening for respiratory tuberculosis: Secondary | ICD-10-CM | POA: Diagnosis not present

## 2020-02-12 DIAGNOSIS — Z111 Encounter for screening for respiratory tuberculosis: Secondary | ICD-10-CM | POA: Diagnosis not present

## 2020-02-14 DIAGNOSIS — Z3483 Encounter for supervision of other normal pregnancy, third trimester: Secondary | ICD-10-CM | POA: Diagnosis not present

## 2020-02-14 DIAGNOSIS — Z3482 Encounter for supervision of other normal pregnancy, second trimester: Secondary | ICD-10-CM | POA: Diagnosis not present

## 2020-03-16 DIAGNOSIS — Z3482 Encounter for supervision of other normal pregnancy, second trimester: Secondary | ICD-10-CM | POA: Diagnosis not present

## 2020-03-16 DIAGNOSIS — Z3483 Encounter for supervision of other normal pregnancy, third trimester: Secondary | ICD-10-CM | POA: Diagnosis not present

## 2020-05-18 DIAGNOSIS — H5213 Myopia, bilateral: Secondary | ICD-10-CM | POA: Diagnosis not present

## 2020-05-18 DIAGNOSIS — H52223 Regular astigmatism, bilateral: Secondary | ICD-10-CM | POA: Diagnosis not present

## 2020-10-30 ENCOUNTER — Other Ambulatory Visit: Payer: Self-pay | Admitting: Women's Health

## 2020-10-30 MED ORDER — NORETHINDRONE 0.35 MG PO TABS: 1.0000 | ORAL_TABLET | Freq: Every day | ORAL | 2 refills | Status: DC

## 2020-11-27 ENCOUNTER — Encounter: Payer: Self-pay | Admitting: Women's Health

## 2020-11-27 ENCOUNTER — Other Ambulatory Visit (HOSPITAL_COMMUNITY)
Admission: RE | Admit: 2020-11-27 | Discharge: 2020-11-27 | Disposition: A | Discharge status: Home / Self Care | Payer: Medicaid Other | Source: Ambulatory Visit | Attending: Women's Health | Admitting: Women's Health

## 2020-11-27 ENCOUNTER — Ambulatory Visit (INDEPENDENT_AMBULATORY_CARE_PROVIDER_SITE_OTHER): Payer: Medicaid Other | Admitting: Women's Health

## 2020-11-27 ENCOUNTER — Other Ambulatory Visit: Payer: Self-pay

## 2020-11-27 VITALS — BP 116/69 | HR 86 | Ht 68.0 in | Wt 125.0 lb

## 2020-11-27 DIAGNOSIS — N6320 Unspecified lump in the left breast, unspecified quadrant: Secondary | ICD-10-CM

## 2020-11-27 DIAGNOSIS — Z113 Encounter for screening for infections with a predominantly sexual mode of transmission: Secondary | ICD-10-CM | POA: Insufficient documentation

## 2020-11-27 DIAGNOSIS — Z01419 Encounter for gynecological examination (general) (routine) without abnormal findings: Secondary | ICD-10-CM | POA: Diagnosis not present

## 2020-11-27 NOTE — Progress Notes (Signed)
WELL-WOMAN EXAMINATION Patient name: Emily [name] MRN [id number]  Date of birth: [birth year] Chief Complaint:   Gynecologic Exam (Last pap 05/17/19 )  History of Present Illness:   Emily [name] is a 25 y.o. G68P1011 African-American female being seen today for a routine well-woman exam. Still breastfeeding 24yr old, doing well on micronor, just had 1st period since having baby. May want another baby soon.   Current complaints: none  PCP: none      does not desire labs Patient's last menstrual period was 11/06/2020. The current method of family planning is oral progesterone-only contraceptive.  Last pap 05/17/19. Results were: NILM w/ HRHPV not done. H/O abnormal pap: no Last mammogram: never. Results were: N/A. Family h/o breast cancer: no Last colonoscopy: never. Results were: N/A. Family h/o colorectal cancer: no  Depression screen [facility] 2/9 11/27/2020 07/14/2019 04/12/2019 01/12/2019  Decreased Interest 1 0 0 0  Down, Depressed, Hopeless 0 0 0 0  PHQ - 2 Score 1 0 0 0  Altered sleeping 1 0 0 -  Tired, decreased energy 1 1 0 -  Change in appetite 0 0 0 -  Feeling bad or failure about yourself  0 0 0 -  Trouble concentrating 0 0 0 -  Moving slowly or fidgety/restless 0 0 0 -  Suicidal thoughts 0 0 0 -  PHQ-9 Score 3 1 0 -  Difficult doing work/chores - - Not difficult at all -  Some encounter information is confidential and restricted. Go to Review Flowsheets activity to see all data.     GAD 7 : Generalized Anxiety Score 11/27/2020 07/14/2019 04/12/2019  Nervous, Anxious, on Edge 1 0 0  Control/stop worrying - 0 0  Worry too much - different things 0 0 0  Trouble relaxing 0 0 0  Restless 0 0 0  Easily annoyed or irritable 1 0 0  Afraid - awful might happen 0 0 0  Total GAD 7 Score - 0 0  Anxiety Difficulty - - Not difficult at all     Review of Systems:   Pertinent items are noted in HPI Denies any headaches, blurred vision, fatigue, shortness of breath, chest pain, abdominal  pain, abnormal vaginal discharge/itching/odor/irritation, problems with periods, bowel movements, urination, or intercourse unless otherwise stated above. Pertinent History Reviewed:  Reviewed past medical,surgical, social and family history.  Reviewed problem list, medications and allergies. Physical Assessment:   Vitals:   11/27/20 0959  BP: 116/69  Pulse: 86  Weight: 125 lb (56.7 kg)  Height: 5\' 8"  (1.727 m)  Body mass index is 19.01 kg/m.        Physical Examination:   General appearance - well appearing, and in no distress  Mental status - alert, oriented to person, place, and time  Psych:  She has a normal mood and affect  Skin - warm and dry, normal color, no suspicious lesions noted  Chest - effort normal, all lung fields clear to auscultation bilaterally  Heart - normal rate and regular rhythm  Neck:  midline trachea, no thyromegaly or nodules  Breasts - Rt breast appears normal, no suspicious masses, no skin or nipple changes or axillary nodes; Lt breast w/ ~1cm firm mobile nontender mass @ 4 o'clock, 4-79fb from nipple, also has prominent vein Lt breast   Abdomen - soft, nontender, nondistended, no masses or organomegaly  Pelvic - VULVA: normal appearing vulva with no masses, tenderness or lesions  VAGINA: normal appearing vagina with normal color and discharge, no lesions  CERVIX: normal appearing cervix without discharge or lesions, no CMT  Thin prep pap is not done   UTERUS: uterus is felt to be normal size, shape, consistency and nontender   ADNEXA: No adnexal masses or tenderness noted.  Extremities:  No swelling or varicosities noted  Chaperone: Faith Rogue    No results found for this or any previous visit (from the past 24 hour(s)).  Assessment & Plan:  1) Well-Woman Exam  2) Lt breast mass w/ prominent vein> still breastfeeding, will get u/s, ordered and note routed to [facility] to schedule/notify pt  3) May want another baby soon> start pnv/stop micronor when  ready  4) STD screen  Labs/procedures today: gc/ct  Mammogram:  diagnostic u/s asap Colonoscopy: @ 23yo, or sooner if problems  Orders Placed This Encounter  Procedures   US BREAST LTD UNI LEFT INC AXILLA    Meds: No orders of the defined types were placed in this encounter.   Follow-up: Return in about 1 year (around 11/27/2021) for Physical.  Cheral Marker CNM, [facility] 11/27/2020 10:35 AM

## 2020-11-28 LAB — CERVICOVAGINAL ANCILLARY ONLY
Chlamydia: NEGATIVE
Comment: NEGATIVE
Comment: NORMAL
Neisseria Gonorrhea: NEGATIVE

## 2020-12-11 ENCOUNTER — Ambulatory Visit (HOSPITAL_COMMUNITY)
Admission: RE | Admit: 2020-12-11 | Discharge: 2020-12-11 | Disposition: A | Discharge status: Home / Self Care | Payer: Medicaid Other | Source: Ambulatory Visit | Attending: Women's Health | Admitting: Women's Health

## 2020-12-11 ENCOUNTER — Other Ambulatory Visit: Payer: Self-pay

## 2020-12-11 DIAGNOSIS — N6320 Unspecified lump in the left breast, unspecified quadrant: Secondary | ICD-10-CM | POA: Diagnosis not present

## 2020-12-11 DIAGNOSIS — N6489 Other specified disorders of breast: Secondary | ICD-10-CM | POA: Diagnosis not present

## 2021-02-06 ENCOUNTER — Encounter: Payer: Self-pay | Admitting: Women's Health

## 2021-02-07 ENCOUNTER — Other Ambulatory Visit: Payer: Self-pay

## 2021-02-07 MED ORDER — NORETHINDRONE 0.35 MG PO TABS: 1.0000 | ORAL_TABLET | Freq: Every day | ORAL | 3 refills | Status: DC

## 2021-09-05 ENCOUNTER — Telehealth: Payer: Medicaid Other | Admitting: Physician Assistant

## 2021-09-05 DIAGNOSIS — L700 Acne vulgaris: Secondary | ICD-10-CM

## 2021-09-05 MED ORDER — CLINDAMYCIN PHOS-BENZOYL PEROX 1.2-5 % EX GEL: 1.0000 "application " | Freq: Every day | CUTANEOUS | 0 refills | Status: DC

## 2021-09-05 NOTE — Progress Notes (Signed)
I have spent 5 minutes in review of e-visit questionnaire, review and updating patient chart, medical decision making and response to patient.   Tarez Bowns Cody Elberta Lachapelle, PA-C    

## 2021-09-05 NOTE — Progress Notes (Signed)
We are sorry that you are experiencing this issue.  Here is how we plan to help!  Based on what you shared with me it looks like you have uncomplicated acne.  Acne is a disorder of the hair follicles and oil glands (sebaceous glands). The sebaceous glands secrete oils to keep the skin moist.  When the glands get clogged, it can lead to pimples or cysts.  These cysts may become infected and leave scars. Acne is very common and normally occurs at puberty.  Acne is also inherited.  Your personal care plan consists of the following recommendations:  I recommend that you use a daily cleanser  You might try an over the counter cleanser that has benzoyl peroxide.  I recommend that you start with a product that has 2.5% benzoyl peroxide.  Stronger concentrations have not been shown to be more effective.  I have prescribed a topical gel with an antibiotic:  Clindamycin-benzoyl peroxide gel.  This gel should be applied to the affected areas twice a day.  Be sure to read the package insert to understand potential side effects.   If excessive dryness or peeling occurs, reduce dose frequency or concentration of the topical scrubs.  If excessive stinging or burning occurs, remove the topical gel with mild soap and water and resume at a lower dose the next day.  Remember oral antibiotics and topical acne treatments may increase your sensitivity to the sun!  HOME CARE: Do not squeeze pimples because that can often lead to infections, worse acne, and scars. Use a moisturizer that contains retinoid or fruit acids that may inhibit the development of new acne lesions. Although there is not a clear link that foods can cause acne, doctors do believe that too many sweets predispose you to skin problems.  GET HELP RIGHT AWAY IF: If your acne gets worse or is not better within 10 days. If you become depressed. If you become pregnant, discontinue medications and call your OB/GYN.  MAKE SURE YOU: Understand these  instructions. Will watch your condition. Will get help right away if you are not doing well or get worse.  Thank you for choosing an e-visit.  Your e-visit answers were reviewed by a board certified advanced clinical practitioner to complete your personal care plan. Depending upon the condition, your plan could have included both over the counter or prescription medications.  Please review your pharmacy choice. Make sure the pharmacy is open so you can pick up prescription now. If there is a problem, you may contact your provider through [employer] and have the prescription routed to another pharmacy.  Your safety is important to [place]. If you have drug allergies check your prescription carefully.   For the next 24 hours you can use MyChart to ask questions about today's visit, request a non-urgent call back, or ask for a work or school excuse. You will get an email in the next two days asking about your experience. I hope that your e-visit has been valuable and will speed your recovery.

## 2021-11-06 ENCOUNTER — Telehealth: Payer: Medicaid Other | Admitting: Physician Assistant

## 2021-11-06 DIAGNOSIS — B3731 Acute candidiasis of vulva and vagina: Secondary | ICD-10-CM

## 2021-11-07 MED ORDER — FLUCONAZOLE 150 MG PO TABS: 150.0000 mg | ORAL_TABLET | Freq: Once | ORAL | 0 refills | Status: AC

## 2021-11-07 NOTE — Progress Notes (Signed)
I have spent 5 minutes in review of e-visit questionnaire, review and updating patient chart, medical decision making and response to patient.   Allayah Raineri Cody Trinton Prewitt, PA-C    

## 2021-11-07 NOTE — Progress Notes (Signed)

## 2021-12-26 ENCOUNTER — Telehealth: Payer: Medicaid Other | Admitting: Emergency Medicine

## 2021-12-26 DIAGNOSIS — O219 Vomiting of pregnancy, unspecified: Secondary | ICD-10-CM

## 2021-12-26 NOTE — Progress Notes (Signed)
Because you are pregnant, I am not able to help you through an evisit. You can try calling your new OB's office for guidance. If they cannot help you since they haven't met you yet, try an urgent care.    NOTE: There will be NO CHARGE for this eVisit   If you are having a true medical emergency please call 911.      For an urgent face to face visit, [place] has seven urgent care centers for your convenience:     [facility] [place] [facility] [place] Get Driving Directions [phone] [address], [place] [address]    [facility] [place] [facility]) Get Driving Directions [phone] [place], [place] [address]  [facility] [place] [facility]) Get Driving Directions [phone] [address] [place],  [place]  [address]  [facility] [place] [facility] [address] Get Driving Directions  [phone] [address] [place],  [place] [address]   [facility] Get Driving Directions [phone] [address] [place], [place], [place] [address]   [facility] Get Driving Directions  [phone] [address] [place], [place] [address]   [facility] [place] Get Driving Directions [phone] [address], [place], [place] [address]  Your MyChart E-visit questionnaire answers were reviewed by a board certified advanced clinical practitioner to complete your personal care plan based on your specific symptoms.  Thank you for using e-Visits.

## 2022-01-28 DIAGNOSIS — N912 Amenorrhea, unspecified: Secondary | ICD-10-CM | POA: Diagnosis not present

## 2022-01-28 DIAGNOSIS — Z3689 Encounter for other specified antenatal screening: Secondary | ICD-10-CM | POA: Diagnosis not present

## 2022-01-28 DIAGNOSIS — Z23 Encounter for immunization: Secondary | ICD-10-CM | POA: Diagnosis not present

## 2022-01-28 DIAGNOSIS — Z3201 Encounter for pregnancy test, result positive: Secondary | ICD-10-CM | POA: Diagnosis not present

## 2022-01-28 DIAGNOSIS — Z3A10 10 weeks gestation of pregnancy: Secondary | ICD-10-CM | POA: Diagnosis not present

## 2022-01-28 DIAGNOSIS — O219 Vomiting of pregnancy, unspecified: Secondary | ICD-10-CM | POA: Diagnosis not present

## 2022-01-28 DIAGNOSIS — Z36 Encounter for antenatal screening for chromosomal anomalies: Secondary | ICD-10-CM | POA: Diagnosis not present

## 2022-01-28 DIAGNOSIS — N911 Secondary amenorrhea: Secondary | ICD-10-CM | POA: Diagnosis not present

## 2022-02-12 ENCOUNTER — Telehealth: Payer: Medicaid Other | Admitting: Physician Assistant

## 2022-02-12 DIAGNOSIS — K0889 Other specified disorders of teeth and supporting structures: Secondary | ICD-10-CM | POA: Diagnosis not present

## 2022-02-13 MED ORDER — AMOXICILLIN-POT CLAVULANATE 875-125 MG PO TABS: 1.0000 | ORAL_TABLET | Freq: Two times a day (BID) | ORAL | 0 refills | Status: DC

## 2022-02-13 MED ORDER — NAPROXEN 500 MG PO TABS: 500.0000 mg | ORAL_TABLET | Freq: Two times a day (BID) | ORAL | 0 refills | Status: DC

## 2022-02-13 NOTE — Progress Notes (Signed)
I have spent 5 minutes in review of e-visit questionnaire, review and updating patient chart, medical decision making and response to patient.   Keyna Blizard Cody Denaly Gatling, PA-C    

## 2022-02-13 NOTE — Progress Notes (Signed)
E-Visit for Dental Pain  We are sorry that you are not feeling well.  Here is how we plan to help!  Based on what you have shared with me in the questionnaire, it sounds like you have a possible dental infection. I have prescribed Augmentin 875-125mg  twice a day for 7 days and Naprosyn 500mg  2 times a day for 7 days for discomfort  It is imperative that you see a dentist within 10 days of this eVisit to determine the cause of the dental pain and be sure it is adequately treated  A toothache or tooth pain is caused when the nerve in the root of a tooth or surrounding a tooth is irritated. Dental (tooth) infection, decay, injury, or loss of a tooth are the most common causes of dental pain. Pain may also occur after an extraction (tooth is pulled out). Pain sometimes originates from other areas and radiates to the jaw, thus appearing to be tooth pain.Bacteria growing inside your mouth can contribute to gum disease and dental decay, both of which can cause pain. A toothache occurs from inflammation of the central portion of the tooth called pulp. The pulp contains nerve endings that are very sensitive to pain. Inflammation to the pulp or pulpitis may be caused by dental cavities, trauma, and infection.    HOME CARE:   For toothaches: Over-the-counter pain medications such as acetaminophen or ibuprofen may be used. Take these as directed on the package while you arrange for a dental appointment. Avoid very cold or hot foods, because they may make the pain worse. You may get relief from biting on a cotton ball soaked in oil of cloves. You can get oil of cloves at most drug stores.  For jaw pain:  Aspirin may be helpful for problems in the joint of the jaw in adults. If pain happens every time you open your mouth widely, the temporomandibular joint (TMJ) may be the source of the pain. Yawning or taking a large bite of food may worsen the pain. An appointment with your doctor or dentist will help you  find the cause.     GET HELP RIGHT AWAY IF:  You have a high fever or chills If you have had a recent head or face injury and develop headache, light headedness, nausea, vomiting, or other symptoms that concern you after an injury to your face or mouth, you could have a more serious injury in addition to your dental injury. A facial rash associated with a toothache: This condition may improve with medication. Contact your doctor for them to decide what is appropriate. Any jaw pain occurring with chest pain: Although jaw pain is most commonly caused by dental disease, it is sometimes referred pain from other areas. People with heart disease, especially people who have had stents placed, people with diabetes, or those who have had heart surgery may have jaw pain as a symptom of heart attack or angina. If your jaw or tooth pain is associated with lightheadedness, sweating, or shortness of breath, you should see a doctor as soon as possible. Trouble swallowing or excessive pain or bleeding from gums: If you have a history of a weakened immune system, diabetes, or steroid use, you may be more susceptible to infections. Infections can often be more severe and extensive or caused by unusual organisms. Dental and gum infections in people with these conditions may require more aggressive treatment. An abscess may need draining or IV antibiotics, for example.  MAKE SURE YOU  Understand these instructions. Will watch your condition. Will get help right away if you are not doing well or get worse.  Thank you for choosing an e-visit.  Your e-visit answers were reviewed by a board certified advanced clinical practitioner to complete your personal care plan. Depending upon the condition, your plan could have included both over the counter or prescription medications.  Please review your pharmacy choice. Make sure the pharmacy is open so you can pick up prescription now. If there is a problem, you may contact  your provider through MyChart messaging and have the prescription routed to another pharmacy.  Your safety is important to us. If you have drug allergies check your prescription carefully.   For the next 24 hours you can use MyChart to ask questions about today's visit, request a non-urgent call back, or ask for a work or school excuse. You will get an email in the next two days asking about your experience. I hope that your e-visit has been valuable and will speed your recovery.  

## 2022-02-19 DIAGNOSIS — Z6820 Body mass index (BMI) 20.0-20.9, adult: Secondary | ICD-10-CM | POA: Diagnosis not present

## 2022-02-19 DIAGNOSIS — H6122 Impacted cerumen, left ear: Secondary | ICD-10-CM | POA: Diagnosis not present

## 2022-02-25 DIAGNOSIS — Z3689 Encounter for other specified antenatal screening: Secondary | ICD-10-CM | POA: Diagnosis not present

## 2022-02-27 ENCOUNTER — Telehealth: Payer: Medicaid Other | Admitting: Physician Assistant

## 2022-02-27 ENCOUNTER — Encounter: Payer: Self-pay | Admitting: Physician Assistant

## 2022-02-27 DIAGNOSIS — H1030 Unspecified acute conjunctivitis, unspecified eye: Secondary | ICD-10-CM | POA: Diagnosis not present

## 2022-02-27 MED ORDER — POLYMYXIN B-TRIMETHOPRIM 10000-0.1 UNIT/ML-% OP SOLN: OPHTHALMIC | 0 refills | Status: DC

## 2022-02-27 NOTE — Progress Notes (Signed)

## 2022-02-27 NOTE — Progress Notes (Signed)
I have spent 5 minutes in review of e-visit questionnaire, review and updating patient chart, medical decision making and response to patient.   Joelle Roswell Cody Casimer [place]sett, PA-C    

## 2022-04-01 DIAGNOSIS — Z3689 Encounter for other specified antenatal screening: Secondary | ICD-10-CM | POA: Diagnosis not present

## 2022-04-17 ENCOUNTER — Telehealth: Payer: BLUE CROSS/BLUE SHIELD | Admitting: Physician Assistant

## 2022-04-17 DIAGNOSIS — J019 Acute sinusitis, unspecified: Secondary | ICD-10-CM | POA: Diagnosis not present

## 2022-04-17 DIAGNOSIS — B9689 Other specified bacterial agents as the cause of diseases classified elsewhere: Secondary | ICD-10-CM

## 2022-04-17 MED ORDER — AMOXICILLIN 875 MG PO TABS
875.0000 mg | ORAL_TABLET | Freq: Two times a day (BID) | ORAL | 0 refills | Status: AC
Start: 2022-04-17 — End: 2022-04-27

## 2022-04-17 NOTE — Progress Notes (Signed)
E-Visit for Sinus Problems  We are sorry that you are not feeling well.  Here is how we plan to help!  Based on what you have shared with me it looks like you have sinusitis.  Sinusitis is inflammation and infection in the sinus cavities of the head.  Based on your presentation I believe you most likely have Acute Bacterial Sinusitis.  This is an infection caused by bacteria and is treated with antibiotics. I have prescribed Amoxicillin 875mg Take 1 tablet twice daily for 10 days.  You may use an oral decongestant such as Mucinex D or if you have glaucoma or high blood pressure use plain Mucinex. Saline nasal spray help and can safely be used as often as needed for congestion.  If you develop worsening sinus pain, fever or notice severe headache and vision changes, or if symptoms are not better after completion of antibiotic, please schedule an appointment with a health care provider.    Sinus infections are not as easily transmitted as other respiratory infection, however we still recommend that you avoid close contact with loved ones, especially the very young and elderly.  Remember to wash your hands thoroughly throughout the day as this is the number one way to prevent the spread of infection!  Home Care: Only take medications as instructed by your medical team. Complete the entire course of an antibiotic. Do not take these medications with alcohol. A steam or ultrasonic humidifier can help congestion.  You can place a towel over your head and breathe in the steam from hot water coming from a faucet. Avoid close contacts especially the very young and the elderly. Cover your mouth when you cough or sneeze. Always remember to wash your hands.  Get Help Right Away If: You develop worsening fever or sinus pain. You develop a severe head ache or visual changes. Your symptoms persist after you have completed your treatment plan.  Make sure you Understand these instructions. Will watch your  condition. Will get help right away if you are not doing well or get worse.  Thank you for choosing an e-visit.  Your e-visit answers were reviewed by a board certified advanced clinical practitioner to complete your personal care plan. Depending upon the condition, your plan could have included both over the counter or prescription medications.  Please review your pharmacy choice. Make sure the pharmacy is open so you can pick up prescription now. If there is a problem, you may contact your provider through MyChart messaging and have the prescription routed to another pharmacy.  Your safety is important to us. If you have drug allergies check your prescription carefully.   For the next 24 hours you can use MyChart to ask questions about today's visit, request a non-urgent call back, or ask for a work or school excuse. You will get an email in the next two days asking about your experience. I hope that your e-visit has been valuable and will speed your recovery.  I have spent 5 minutes in review of e-visit questionnaire, review and updating patient chart, medical decision making and response to patient.   Dewie Ahart M Moroni Nester, PA-C  

## 2022-05-27 DIAGNOSIS — Z3689 Encounter for other specified antenatal screening: Secondary | ICD-10-CM | POA: Diagnosis not present

## 2022-05-27 DIAGNOSIS — Z23 Encounter for immunization: Secondary | ICD-10-CM | POA: Diagnosis not present

## 2022-07-23 DIAGNOSIS — Z3685 Encounter for antenatal screening for Streptococcus B: Secondary | ICD-10-CM | POA: Diagnosis not present

## 2022-07-23 DIAGNOSIS — Z3689 Encounter for other specified antenatal screening: Secondary | ICD-10-CM | POA: Diagnosis not present

## 2022-09-17 DIAGNOSIS — R87616 Satisfactory cervical smear but lacking transformation zone: Secondary | ICD-10-CM | POA: Diagnosis not present

## 2022-11-20 ENCOUNTER — Telehealth: Payer: Medicaid Other | Admitting: Physician Assistant

## 2022-11-20 DIAGNOSIS — B9689 Other specified bacterial agents as the cause of diseases classified elsewhere: Secondary | ICD-10-CM | POA: Diagnosis not present

## 2022-11-20 DIAGNOSIS — J019 Acute sinusitis, unspecified: Secondary | ICD-10-CM

## 2022-11-20 MED ORDER — AMOXICILLIN 875 MG PO TABS: 875.0000 mg | ORAL_TABLET | Freq: Two times a day (BID) | ORAL | 0 refills | Status: AC

## 2022-11-20 NOTE — Progress Notes (Signed)
I have spent 5 minutes in review of e-visit questionnaire, review and updating patient chart, medical decision making and response to patient.   Mia Milan Cody Jacklynn Dehaas, PA-C    

## 2022-11-20 NOTE — Progress Notes (Signed)
E-Visit for Sinus Problems  We are sorry that you are not feeling well.  Here is how we plan to help!  Based on what you have shared with me it looks like you have sinusitis.  Sinusitis is inflammation and infection in the sinus cavities of the head.  Based on your presentation I believe you most likely have Acute Bacterial Sinusitis.  This is an infection caused by bacteria and is treated with antibiotics. I have prescribed Amoxicillin 875 mg twice daily for 10 days. This is safe in breastfeeding but we always recommend mothers monitor baby for any stomach upset to report to pediatrician.  You may use plain Mucinex. Saline nasal spray help and can safely be used as often as needed for congestion.  If you develop worsening sinus pain, fever or notice severe headache and vision changes, or if symptoms are not better after completion of antibiotic, please schedule an appointment with a health care provider.    Sinus infections are not as easily transmitted as other respiratory infection, however we still recommend that you avoid close contact with loved ones, especially the very young and elderly.  Remember to wash your hands thoroughly throughout the day as this is the number one way to prevent the spread of infection!  Home Care: Only take medications as instructed by your medical team. Complete the entire course of an antibiotic. Do not take these medications with alcohol. A steam or ultrasonic humidifier can help congestion.  You can place a towel over your head and breathe in the steam from hot water coming from a faucet. Avoid close contacts especially the very young and the elderly. Cover your mouth when you cough or sneeze. Always remember to wash your hands.  Get Help Right Away If: You develop worsening fever or sinus pain. You develop a severe head ache or visual changes. Your symptoms persist after you have completed your treatment plan.  Make sure you Understand these  instructions. Will watch your condition. Will get help right away if you are not doing well or get worse.  Thank you for choosing an e-visit.  Your e-visit answers were reviewed by a board certified advanced clinical practitioner to complete your personal care plan. Depending upon the condition, your plan could have included both over the counter or prescription medications.  Please review your pharmacy choice. Make sure the pharmacy is open so you can pick up prescription now. If there is a problem, you may contact your provider through [employer] and have the prescription routed to another pharmacy.  Your safety is important to [place]. If you have drug allergies check your prescription carefully.   For the next 24 hours you can use MyChart to ask questions about today's visit, request a non-urgent call back, or ask for a work or school excuse. You will get an email in the next two days asking about your experience. I hope that your e-visit has been valuable and will speed your recovery.

## 2023-03-16 ENCOUNTER — Telehealth: Admitting: Physician Assistant

## 2023-03-16 DIAGNOSIS — M545 Low back pain, unspecified: Secondary | ICD-10-CM

## 2023-03-17 NOTE — Progress Notes (Signed)
  Because this is an ongoing, intermittent issue over the past year, I feel your condition warrants further evaluation and I recommend that you be seen in a face-to-face visit.   NOTE: There will be NO CHARGE for this E-Visit   If you are having a true medical emergency, please call 911.     For an urgent face to face visit, [facility] has multiple urgent care centers for your convenience.  Click the link below for the full list of locations and hours, walk-in wait times, appointment scheduling options and driving directions:  Urgent Care - [place], [place], [place], [place], [place], [place]  [place]     Your MyChart E-visit questionnaire answers were reviewed by a board certified advanced clinical practitioner to complete your personal care plan based on your specific symptoms.    Thank you for using e-Visits.

## 2023-03-26 IMAGING — US US BREAST*L* LIMITED INC AXILLA
1 series · 9 of 9 positions shown · non-contrast
Comparison: None.

CLINICAL DATA: 25-year-old female with a palpable area of concern
in the left breast.

EXAM:
ULTRASOUND OF THE LEFT BREAST

[Series 1: us breast*left* limited inc axilla · 0.07mm/px · 9 of 9 slices shown]
[im 1/9]
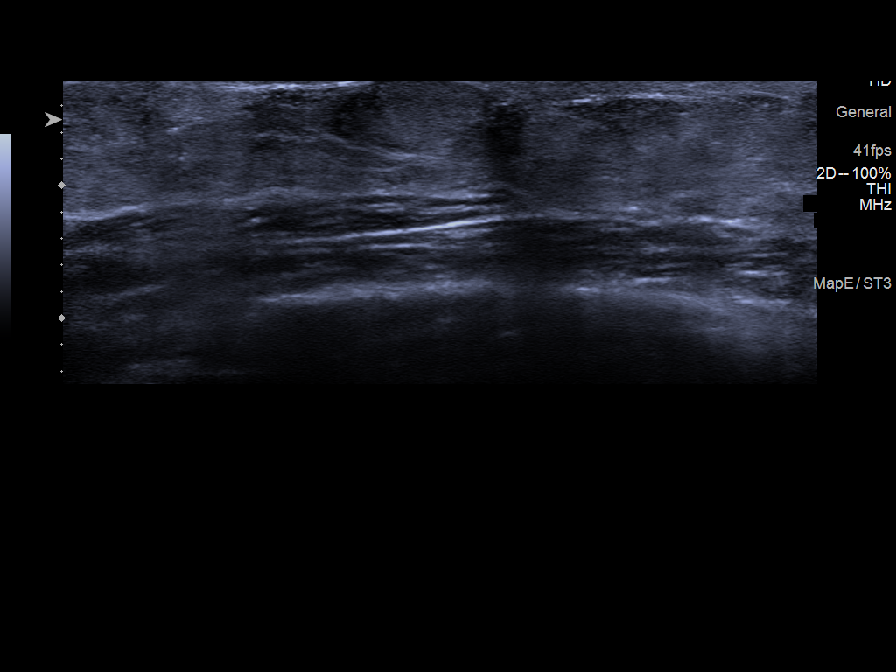
[im 2/9]
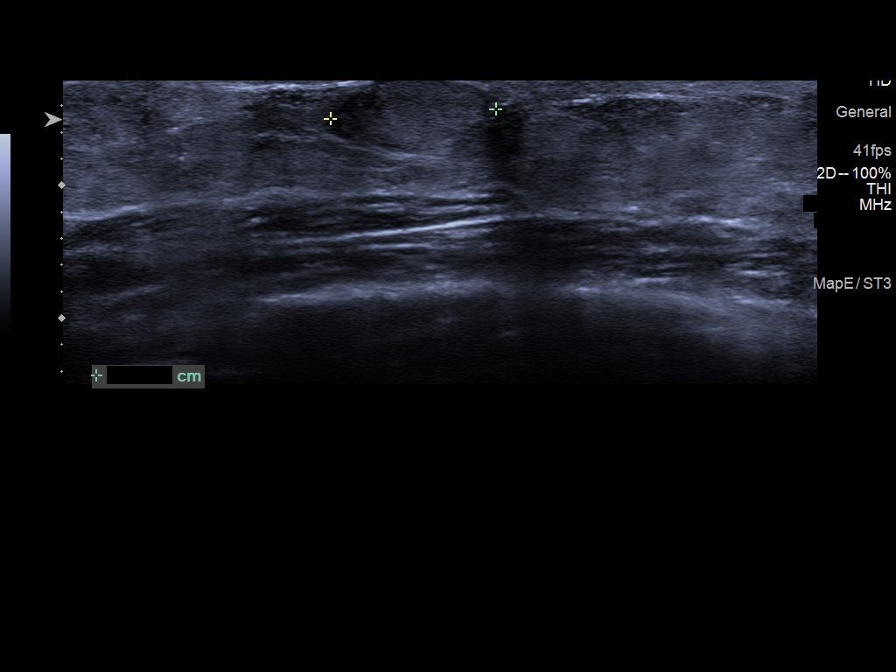
[im 3/9]
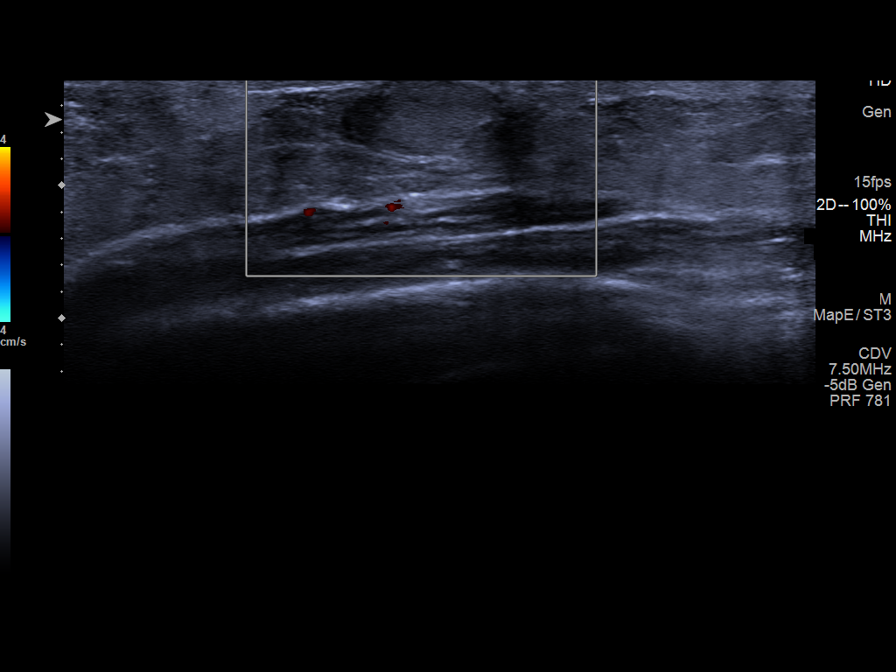
[im 4/9]
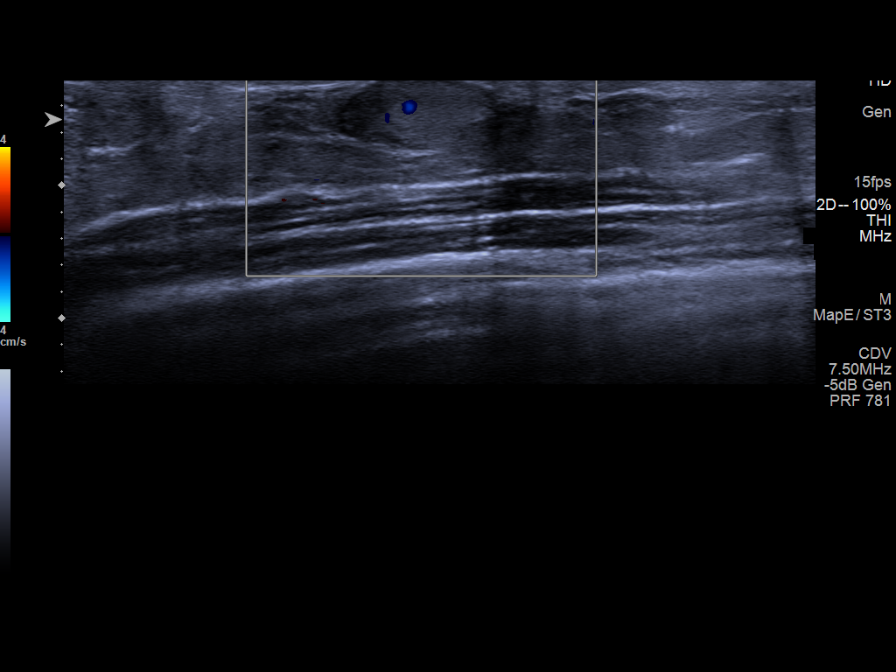
[im 5/9]
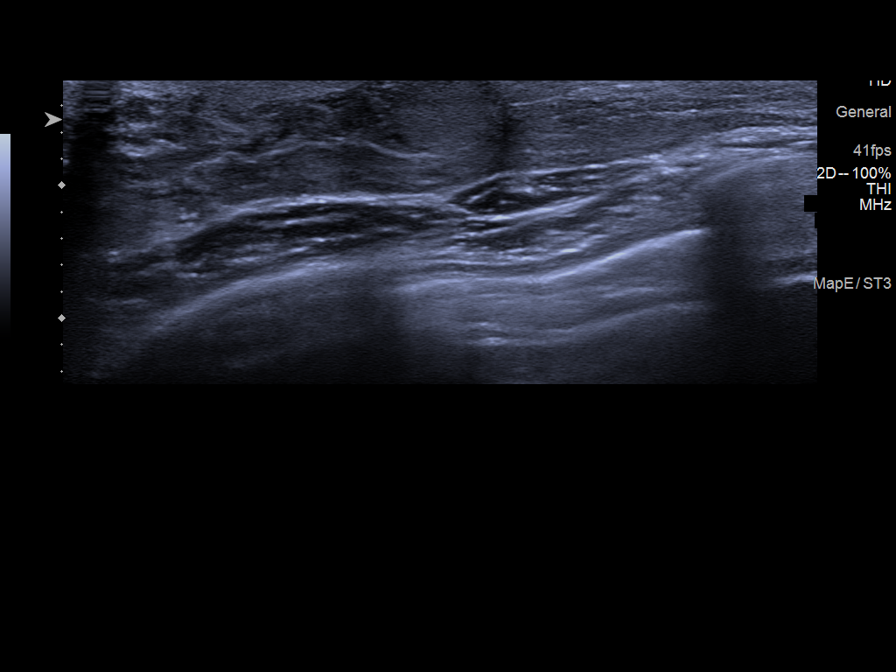
[im 6/9]
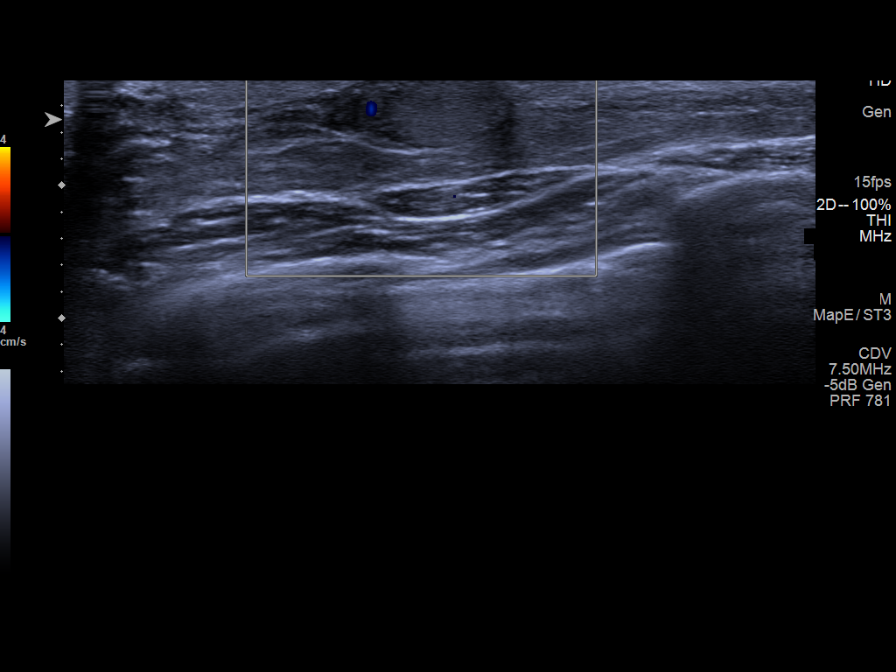
[im 7/9]
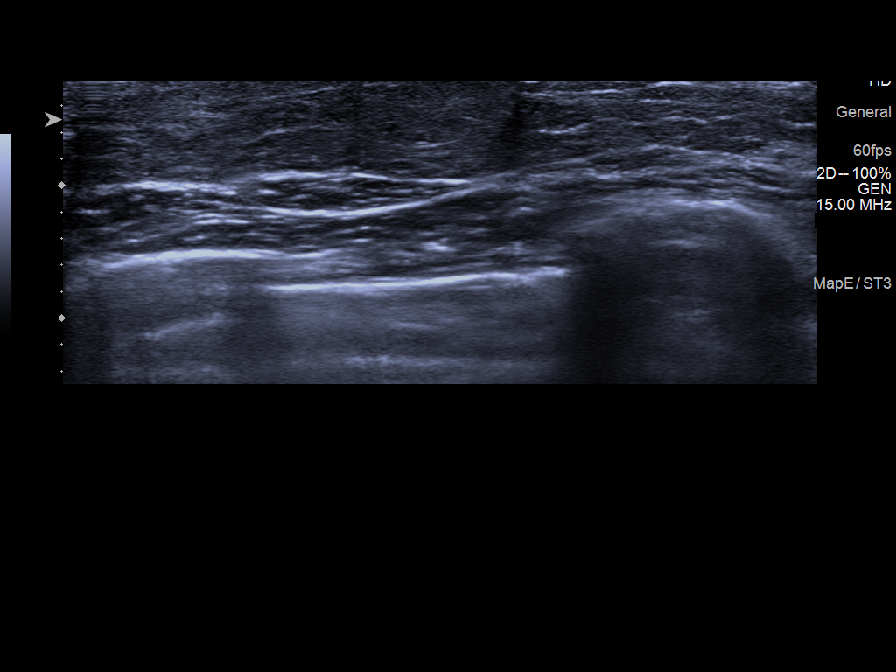
[im 8/9]
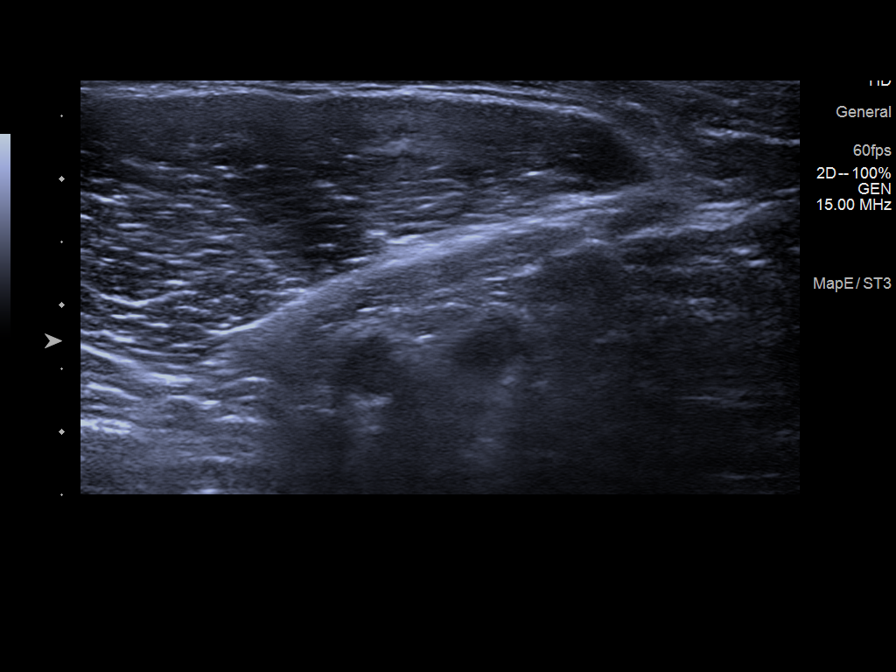
[im 9/9]
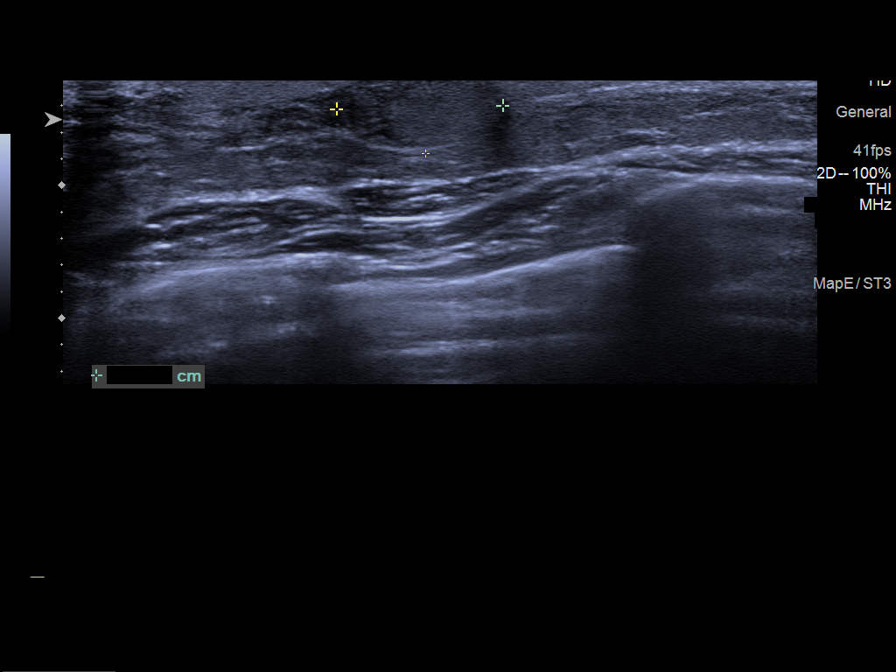

[9 of 9 positions shown; findings below may reference images not displayed]

FINDINGS: Physical examination at the site of palpable concern in the lower
outer left breast reveals a firm highly mobile mass.

Targeted ultrasound of the left breast was performed. There is an
oval mildly hypoechoic circumscribed mass at 4 o'clock 5 cm from
nipple measuring 1.3 x 0.6 x 1.3 cm. This demonstrates imaging
features suggestive of a fibroadenoma.
IMPRESSION: Probably benign left breast mass.

RECOMMENDATION:
Left breast ultrasound in 6 months.

I have discussed the findings and recommendations with the patient.
If applicable, a reminder letter will be sent to the patient
regarding the next appointment.

BI-RADS CATEGORY  3: Probably benign.

## 2023-06-03 ENCOUNTER — Emergency Department (HOSPITAL_COMMUNITY)
Admission: EM | Admit: 2023-06-03 | Discharge: 2023-06-03 | Disposition: A | Discharge status: Home / Self Care | Attending: Emergency Medicine | Admitting: Emergency Medicine

## 2023-06-03 ENCOUNTER — Other Ambulatory Visit: Payer: Self-pay

## 2023-06-03 ENCOUNTER — Emergency Department (HOSPITAL_COMMUNITY)

## 2023-06-03 DIAGNOSIS — S86911A Strain of unspecified muscle(s) and tendon(s) at lower leg level, right leg, initial encounter: Secondary | ICD-10-CM | POA: Insufficient documentation

## 2023-06-03 DIAGNOSIS — M79661 Pain in right lower leg: Secondary | ICD-10-CM | POA: Diagnosis present

## 2023-06-03 DIAGNOSIS — J45909 Unspecified asthma, uncomplicated: Secondary | ICD-10-CM | POA: Insufficient documentation

## 2023-06-03 DIAGNOSIS — R252 Cramp and spasm: Secondary | ICD-10-CM | POA: Diagnosis not present

## 2023-06-03 DIAGNOSIS — X58XXXA Exposure to other specified factors, initial encounter: Secondary | ICD-10-CM | POA: Diagnosis not present

## 2023-06-03 DIAGNOSIS — S86811A Strain of other muscle(s) and tendon(s) at lower leg level, right leg, initial encounter: Secondary | ICD-10-CM

## 2023-06-03 MED ORDER — IBUPROFEN 600 MG PO TABS
600.0000 mg | ORAL_TABLET | Freq: Three times a day (TID) | ORAL | 0 refills | Status: DC
Start: 2023-06-03 — End: 2023-11-27

## 2023-06-03 NOTE — ED Provider Notes (Signed)
 [place] EMERGENCY DEPARTMENT AT [facility] Provider Note   CSN: [id number] Arrival date & time: 06/03/23  1020     History  Chief Complaint  Patient presents with   Leg Pain    Emily [name] is a 28 y.o. female with a history of asthma, alpha thalassemia trait along with strong family history of hypercoagulability presenting for evaluation of right calf pain without noticeable swelling.  Her symptoms have been present intermittently for about 2 weeks but has been constant for the past 2 days.  She describes a deep cramp like pain that has not relented x 2 days.  She denies fevers or chills, denies injury to the extremity.  She also denies shortness of breath, chest pain or any other complaint.  She has taken ibuprofen  without improvement in her symptoms.  She is on oral contraceptives.  Denies sedentary lifestyle.  The history is provided by the patient.       Home Medications Prior to Admission medications   Medication Sig Start Date End Date Taking? Authorizing Provider  ibuprofen  (ADVIL ) 600 MG tablet Take 1 tablet (600 mg total) by mouth 3 (three) times daily. 06/03/23  Yes Manford Sprong, PA-C  cetirizine  (ZYRTEC ) 10 MG tablet     [provider]  naproxen  (NAPROSYN ) 500 MG tablet Take 1 tablet (500 mg total) by mouth 2 (two) times daily with a meal. 02/13/22   Farris Hong, PA-C  Prenatal Vit-Fe Fumarate-FA (PRENATAL MULTIVITAMIN) TABS tablet Take 1 tablet by mouth daily at 12 noon. Patient not taking: Reported on 11/21/2019    [provider]  trimethoprim -polymyxin b  (POLYTRIM ) ophthalmic solution Apply 1-2 drops into affected eye QID x 5 days. 02/27/22   Farris Hong, PA-C      Allergies    Patient has no known allergies.    Review of Systems   Review of Systems  Constitutional:  Negative for fever.  HENT:  Negative for congestion and sore throat.   Eyes: Negative.   Respiratory:  Negative for chest tightness and shortness of  breath.   Cardiovascular:  Negative for chest pain.  Gastrointestinal:  Negative for abdominal pain and nausea.  Genitourinary: Negative.   Musculoskeletal:  Positive for myalgias. Negative for arthralgias, joint swelling and neck pain.  Skin: Negative.  Negative for rash and wound.  Neurological:  Negative for dizziness, weakness, light-headedness, numbness and headaches.  Psychiatric/Behavioral: Negative.      Physical Exam Updated Vital Signs BP 131/81 (BP Location: Right Arm)   Pulse 84   Temp 98.4 F (36.9 C) (Oral)   Resp 15   Ht 5\' 7"  (1.702 m)   Wt 61.2 kg   SpO2 100%   Breastfeeding Yes   BMI 21.14 kg/m  Physical Exam Vitals and nursing note reviewed.  Constitutional:      Appearance: She is well-developed.  HENT:     Head: Normocephalic and atraumatic.  Eyes:     Conjunctiva/sclera: Conjunctivae normal.  Cardiovascular:     Rate and Rhythm: Normal rate and regular rhythm.     Heart sounds: Normal heart sounds.  Pulmonary:     Effort: Pulmonary effort is normal.     Breath sounds: Normal breath sounds.  Musculoskeletal:        General: Tenderness present. No swelling. Normal range of motion.     Cervical back: Normal range of motion.     Comments: Patient is tender to palpation mid right calf without induration, erythema or skin changes.  No palpable cords, dorsalis pedal pulses normal.  No peripheral edema.  Skin:    General: Skin is warm and dry.  Neurological:     Mental Status: She is alert.     ED Results / Procedures / Treatments   Labs (all labs ordered are listed, but only abnormal results are displayed) Labs Reviewed - No data to display  EKG None  Radiology US  Venous Img Lower Right (DVT Study) Result Date: 06/03/2023 CLINICAL DATA:  Sudden onset cramping in RIGHT mid calf EXAM: RIGHT LOWER EXTREMITY VENOUS DOPPLER ULTRASOUND TECHNIQUE: Gray-scale sonography with compression, as well as color and duplex ultrasound, were performed to  evaluate the deep venous system(s) from the level of the common femoral vein through the popliteal and proximal calf veins. COMPARISON:  None available FINDINGS: VENOUS Normal compressibility of the common femoral, superficial femoral, and popliteal veins, as well as the visualized calf veins. Visualized portions of profunda femoral vein and great saphenous vein unremarkable. No filling defects to suggest DVT on grayscale or color Doppler imaging. Doppler waveforms show normal direction of venous flow, normal respiratory plasticity and response to augmentation. Limited views of the contralateral common femoral vein are unremarkable. OTHER None. Limitations: none IMPRESSION: No right lower extremity DVT. Electronically Signed   By: Elester Grim M.D.   On: 06/03/2023 12:21    Procedures Procedures    Medications Ordered in ED Medications - No data to display  ED Course/ Medical Decision Making/ A&P                                 Medical Decision Making Patient presenting with right calf pain intermittent x 2 weeks, now constant x 2 days.  Significant risk for DVT including family history and OCPs.  Although her exam does not obviously suggest DVT given her risk an ultrasound study was completed and is negative for DVT.  Suspect muscle strain isolated to the right gastroc.  Patient to increase ibuprofen , add heat therapy which was discussed, plan as needed follow-up with her primary provider if her symptoms do not resolve with this treatment plan.  There is no erythema to suggest cellulitis.  Risk Prescription drug management.           Final Clinical Impression(s) / ED Diagnoses Final diagnoses:  Strain of calf muscle, right, initial encounter    Rx / DC Orders ED Discharge Orders          Ordered    ibuprofen  (ADVIL ) 600 MG tablet  3 times daily        06/03/23 1232              Katherine Pancake, Kirby Peoples 06/03/23 1242    Mordecai Applebaum, MD 06/03/23 1425

## 2023-06-03 NOTE — Discharge Instructions (Signed)
 Your ultrasound is negative for a blood clot in your leg.  I suspect your symptoms are secondary to a pulled muscle.  I recommend ibuprofen  as prescribed, you may also try a heating pad for 20 minutes 2-3 times daily.  Plan to follow-up with your primary doctor if this does not alleviate your symptoms.

## 2023-06-03 NOTE — ED Triage Notes (Signed)
 Pt c/o right calf pain that started intermittently x 2 weeks ago but since then for the last 2 days has been constant  Pt describes the pain as a cramp and pt denies any redness or warmth

## 2023-06-13 ENCOUNTER — Telehealth: Admitting: Physician Assistant

## 2023-06-13 DIAGNOSIS — A084 Viral intestinal infection, unspecified: Secondary | ICD-10-CM | POA: Diagnosis not present

## 2023-06-13 NOTE — Progress Notes (Signed)
 I have spent 5 minutes in review of e-visit questionnaire, review and updating patient chart, medical decision making and response to patient.   Piedad Climes, PA-C

## 2023-06-13 NOTE — Progress Notes (Signed)
 Message sent to patient requesting further input regarding current symptoms. Awaiting patient response.

## 2023-06-13 NOTE — Progress Notes (Signed)
 E-Visit for Nausea and Vomiting   We are sorry that you are not feeling well. Here is how we plan to help!  Based on what you have shared with me it looks like you have a Virus that is irritating your GI tract.  Vomiting is the forceful emptying of a portion of the stomach's content through the mouth.  Although nausea and vomiting can make you feel miserable, it's important to remember that these are not diseases, but rather symptoms of an underlying illness.  When we treat short term symptoms, we always caution that any symptoms that persist should be fully evaluated in a medical office.  Because you are solely breastfeeding and we need to be cautious, I would continue use of OTC Pepto Bismol and consider use of ginger either through ginger ale or ginger tea. If you start to notice true vomiting despite this, then we may need to reconsider a prescription medication like Zofran  used very briefly (this is a medicine that is safe in pregnancy for nausea/vomiting but not a lot of studies done in breastfeeding to give concrete recommendations) versus having you seen in person so medication can be given in office.   HOME CARE: Drink clear liquids.  This is very important! Dehydration (the lack of fluid) can lead to a serious complication.  Start off with 1 tablespoon every 5 minutes for 8 hours. You may begin eating bland foods after 8 hours without vomiting.  Start with saltine crackers, white bread, rice, mashed potatoes, applesauce. After 48 hours on a bland diet, you may resume a normal diet. Try to go to sleep.  Sleep often empties the stomach and relieves the need to vomit.  GET HELP RIGHT AWAY IF:  Your symptoms do not improve or worsen within 2 days after treatment. You have a fever for over 3 days. You cannot keep down fluids after trying the medication.  MAKE SURE YOU:  Understand these instructions. Will watch your condition. Will get help right away if you are not doing well or get  worse.    Thank you for choosing an e-visit.  Your e-visit answers were reviewed by a board certified advanced clinical practitioner to complete your personal care plan. Depending upon the condition, your plan could have included both over the counter or prescription medications.  Please review your pharmacy choice. Make sure the pharmacy is open so you can pick up prescription now. If there is a problem, you may contact your provider through [employer] and have the prescription routed to another pharmacy.  Your safety is important to us . If you have drug allergies check your prescription carefully.   For the next 24 hours you can use MyChart to ask questions about today's visit, request a non-urgent call back, or ask for a work or school excuse. You will get an email in the next two days asking about your experience. I hope that your e-visit has been valuable and will speed your recovery.

## 2023-08-03 ENCOUNTER — Telehealth: Admitting: Physician Assistant

## 2023-08-03 DIAGNOSIS — B3731 Acute candidiasis of vulva and vagina: Secondary | ICD-10-CM | POA: Diagnosis not present

## 2023-08-03 MED ORDER — FLUCONAZOLE 150 MG PO TABS: 150.0000 mg | ORAL_TABLET | ORAL | 0 refills | Status: DC | PRN

## 2023-08-03 NOTE — Progress Notes (Signed)

## 2023-08-06 ENCOUNTER — Telehealth: Admitting: Physician Assistant

## 2023-08-06 DIAGNOSIS — B9689 Other specified bacterial agents as the cause of diseases classified elsewhere: Secondary | ICD-10-CM

## 2023-08-06 MED ORDER — METRONIDAZOLE 0.75 % VA GEL: 1.0000 | Freq: Every day | VAGINAL | 0 refills | Status: AC

## 2023-08-06 MED ORDER — METRONIDAZOLE 500 MG PO TABS: 500.0000 mg | ORAL_TABLET | Freq: Two times a day (BID) | ORAL | 0 refills | Status: DC

## 2023-08-06 NOTE — Progress Notes (Signed)

## 2023-08-06 NOTE — Progress Notes (Signed)
 I have spent 5 minutes in review of e-visit questionnaire, review and updating patient chart, medical decision making and response to patient.   Piedad Climes, PA-C

## 2023-09-06 ENCOUNTER — Telehealth: Admitting: Physician Assistant

## 2023-09-06 DIAGNOSIS — J069 Acute upper respiratory infection, unspecified: Secondary | ICD-10-CM

## 2023-09-07 MED ORDER — FLUTICASONE PROPIONATE 50 MCG/ACT NA SUSP: 2.0000 | Freq: Every day | NASAL | 0 refills | Status: AC

## 2023-09-07 NOTE — Progress Notes (Signed)
 E-Visit for Upper Respiratory Infection   We are sorry you are not feeling well.  Here is how we plan to help!  Based on what you have shared with me, it looks like you may have a viral upper respiratory infection.  Upper respiratory infections are caused by a large number of viruses; however, rhinovirus is the most common cause.   Symptoms vary from person to person, with common symptoms including sore throat, cough, fatigue or lack of energy and feeling of general discomfort.  A low-grade fever of up to 100.4 may present, but is often uncommon.  Symptoms vary however, and are closely related to a person's age or underlying illnesses.  The most common symptoms associated with an upper respiratory infection are nasal discharge or congestion, cough, sneezing, headache and pressure in the ears and face.  These symptoms usually persist for about 3 to 10 days, but can last up to 2 weeks.  It is important to know that upper respiratory infections do not cause serious illness or complications in most cases.    Upper respiratory infections can be transmitted from person to person, with the most common method of transmission being a person's hands.  The virus is able to live on the skin and can infect other persons for up to 2 hours after direct contact.  Also, these can be transmitted when someone coughs or sneezes; thus, it is important to cover the mouth to reduce this risk.  To keep the spread of the illness at bay, good hand hygiene is very important.  This is an infection that is most likely caused by a virus. There are no specific treatments other than to help you with the symptoms until the infection runs its course.  We are sorry you are not feeling well.  Here is how we plan to help!   For nasal congestion, you may use an oral decongestants such as Mucinex D or if you have glaucoma or high blood pressure use plain Mucinex.  Saline nasal spray or nasal drops can help and can safely be used as often as  needed for congestion.  For your congestion, I have prescribed Fluticasone  nasal spray one spray in each nostril twice a day  If you do not have a history of heart disease, hypertension, diabetes or thyroid  disease, prostate/bladder issues or glaucoma, you may also use Sudafed to treat nasal congestion.  It is highly recommended that you consult with a pharmacist or your primary care physician to ensure this medication is safe for you to take.     If you have a cough, you may use cough suppressants such as Delsym and Robitussin.  If you have glaucoma or high blood pressure, you can also use Coricidin HBP.   The safest medications for cough while breastfeeding are the OTC cough medications like Mucinex DM, Delsym, or Robitussin.   If you have a sore or scratchy throat, use a saltwater gargle-  to  teaspoon of salt dissolved in a 4-ounce to 8-ounce glass of warm water.  Gargle the solution for approximately 15-30 seconds and then spit.  It is important not to swallow the solution.  You can also use throat lozenges/cough drops and Chloraseptic spray to help with throat pain or discomfort.  Warm or cold liquids can also be helpful in relieving throat pain.  For headache, pain or general discomfort, you can use Ibuprofen  or Tylenol as directed.   Some authorities believe that zinc sprays or the use of Echinacea may  shorten the course of your symptoms.   HOME CARE Only take medications as instructed by your medical team. Be sure to drink plenty of fluids. Water is fine as well as fruit juices, sodas and electrolyte beverages. You may want to stay away from caffeine or alcohol. If you are nauseated, try taking small sips of liquids. How do you know if you are getting enough fluid? Your urine should be a pale yellow or almost colorless. Get rest. Taking a steamy shower or using a humidifier may help nasal congestion and ease sore throat pain. You can place a towel over your head and breathe in the steam  from hot water coming from a faucet. Using a saline nasal spray works much the same way. Cough drops, hard candies and sore throat lozenges may ease your cough. Avoid close contacts especially the very young and the elderly Cover your mouth if you cough or sneeze Always remember to wash your hands.   GET HELP RIGHT AWAY IF: You develop worsening fever. If your symptoms do not improve within 10 days You develop yellow or green discharge from your nose over 3 days. You have coughing fits You develop a severe head ache or visual changes. You develop shortness of breath, difficulty breathing or start having chest pain Your symptoms persist after you have completed your treatment plan  MAKE SURE YOU  Understand these instructions. Will watch your condition. Will get help right away if you are not doing well or get worse.  Thank you for choosing an e-visit.  Your e-visit answers were reviewed by a board certified advanced clinical practitioner to complete your personal care plan. Depending upon the condition, your plan could have included both over the counter or prescription medications.  Please review your pharmacy choice. Make sure the pharmacy is open so you can pick up prescription now. If there is a problem, you may contact your provider through [employer] and have the prescription routed to another pharmacy.  Your safety is important to us . If you have drug allergies check your prescription carefully.   For the next 24 hours you can use MyChart to ask questions about today's visit, request a non-urgent call back, or ask for a work or school excuse. You will get an email in the next two days asking about your experience. I hope that your e-visit has been valuable and will speed your recovery.     I have spent 5 minutes in review of e-visit questionnaire, review and updating patient chart, medical decision making and response to patient.   Delon CHRISTELLA Dickinson, PA-C

## 2023-11-02 ENCOUNTER — Telehealth

## 2023-11-02 ENCOUNTER — Encounter

## 2023-11-02 DIAGNOSIS — L0292 Furuncle, unspecified: Secondary | ICD-10-CM

## 2023-11-03 MED ORDER — CEPHALEXIN 500 MG PO CAPS: 500.0000 mg | ORAL_CAPSULE | Freq: Four times a day (QID) | ORAL | 0 refills | Status: AC

## 2023-11-03 NOTE — Progress Notes (Signed)
   We are sorry that you are not feeling well. Here is how we plan to help!  Based on what you shared with me it looks like you have a boil/abscess that is draining on its own. I recommend you try to apply warm compress to the area for 5-10 minutes a couple of times per day when able. Try to keep the skin clean and dry. I have prescribed  have prescribed:  Keflex  500mg  take one by mouth four times a day for 5 days  HOME CARE:  Take your medications as ordered and take all of them, even if the skin irritation appears to be healing.   GET HELP RIGHT AWAY IF:  Symptoms that don't begin to go away within 48 hours. Severe redness persists or worsens If the area turns color, spreads or swells. If it blisters and opens, develops yellow-brown crust or bleeds. You develop a fever or chills. If the pain increases or becomes unbearable.  Are unable to keep fluids and food down.  MAKE SURE YOU   Understand these instructions. Will watch your condition. Will get help right away if you are not doing well or get worse.  Thank you for choosing an e-visit. Your e-visit answers were reviewed by a board certified advanced clinical practitioner to complete your personal care plan. Depending upon the condition, your plan could have included both over the counter or prescription medications. Please review your pharmacy choice. Make sure the pharmacy is open so you can pick up prescription now. If there is a problem, you may contact your provider through [employer] and have the prescription routed to another pharmacy. Your safety is important to us . If you have drug allergies check your prescription carefully.  For the next 24 hours you can use MyChart to ask questions about today's visit, request a non-urgent call back, or ask for a work or school excuse. You will get an email in the next two days asking about your experience. I hope that your e-visit has been valuable and will speed your  recovery.   I have spent 5 minutes in review of e-visit questionnaire, review and updating patient chart, medical decision making and response to patient.   Elsie Velma Lunger, PA-C

## 2023-11-27 ENCOUNTER — Telehealth: Admitting: Physician Assistant

## 2023-11-27 DIAGNOSIS — R3989 Other symptoms and signs involving the genitourinary system: Secondary | ICD-10-CM | POA: Diagnosis not present

## 2023-11-27 MED ORDER — NITROFURANTOIN MONOHYD MACRO 100 MG PO CAPS: 100.0000 mg | ORAL_CAPSULE | Freq: Two times a day (BID) | ORAL | 0 refills | Status: AC

## 2023-11-27 NOTE — Progress Notes (Signed)

## 2023-12-11 ENCOUNTER — Telehealth: Admitting: Family Medicine

## 2023-12-11 DIAGNOSIS — K047 Periapical abscess without sinus: Secondary | ICD-10-CM | POA: Diagnosis not present

## 2023-12-11 MED ORDER — CLINDAMYCIN HCL 300 MG PO CAPS: 300.0000 mg | ORAL_CAPSULE | Freq: Three times a day (TID) | ORAL | 0 refills | Status: AC

## 2023-12-11 NOTE — Progress Notes (Signed)
 E-Visit for Dental Pain  We are sorry that you are not feeling well.  Here is how we plan to help!  Based on what you have shared with me in the questionnaire, it sounds like you have dental infection.  Clindamycin  300mg  3 times a day for 7 days  It is imperative that you see a dentist within 10 days of this eVisit to determine the cause of the dental pain and be sure it is adequately treated  A toothache or tooth pain is caused when the nerve in the root of a tooth or surrounding a tooth is irritated. Dental (tooth) infection, decay, injury, or loss of a tooth are the most common causes of dental pain. Pain may also occur after an extraction (tooth is pulled out). Pain sometimes originates from other areas and radiates to the jaw, thus appearing to be tooth pain.Bacteria growing inside your mouth can contribute to gum disease and dental decay, both of which can cause pain. A toothache occurs from inflammation of the central portion of the tooth called pulp. The pulp contains nerve endings that are very sensitive to pain. Inflammation to the pulp or pulpitis may be caused by dental cavities, trauma, and infection.    HOME CARE:   For toothaches: Over-the-counter pain medications such as acetaminophen  or ibuprofen  may be used. Take these as directed on the package while you arrange for a dental appointment. Avoid very cold or hot foods, because they may make the pain worse. You may get relief from biting on a cotton ball soaked in oil of cloves. You can get oil of cloves at most drug stores.  For jaw pain:  Aspirin may be helpful for problems in the joint of the jaw in adults. If pain happens every time you open your mouth widely, the temporomandibular joint (TMJ) may be the source of the pain. Yawning or taking a large bite of food may worsen the pain. An appointment with your doctor or dentist will help you find the cause.     GET HELP RIGHT AWAY IF:  You have a high fever or chills If  you have had a recent head or face injury and develop headache, light headedness, nausea, vomiting, or other symptoms that concern you after an injury to your face or mouth, you could have a more serious injury in addition to your dental injury. A facial rash associated with a toothache: This condition may improve with medication. Contact your doctor for them to decide what is appropriate. Any jaw pain occurring with chest pain: Although jaw pain is most commonly caused by dental disease, it is sometimes referred pain from other areas. People with heart disease, especially people who have had stents placed, people with diabetes, or those who have had heart surgery may have jaw pain as a symptom of heart attack or angina. If your jaw or tooth pain is associated with lightheadedness, sweating, or shortness of breath, you should see a doctor as soon as possible. Trouble swallowing or excessive pain or bleeding from gums: If you have a history of a weakened immune system, diabetes, or steroid use, you may be more susceptible to infections. Infections can often be more severe and extensive or caused by unusual organisms. Dental and gum infections in people with these conditions may require more aggressive treatment. An abscess may need draining or IV antibiotics, for example.  MAKE SURE YOU   Understand these instructions. Will watch your condition. Will get help right away if you are  not doing well or get worse.  Thank you for choosing an e-visit.  Your e-visit answers were reviewed by a board certified advanced clinical practitioner to complete your personal care plan. Depending upon the condition, your plan could have included both over the counter or prescription medications.  Please review your pharmacy choice. Make sure the pharmacy is open so you can pick up prescription now. If there is a problem, you may contact your provider through [employer] and have the prescription routed to another  pharmacy.  Your safety is important to us . If you have drug allergies check your prescription carefully.   For the next 24 hours you can use MyChart to ask questions about today's visit, request a non-urgent call back, or ask for a work or school excuse. You will get an email in the next two days asking about your experience. I hope that your e-visit has been valuable and will speed your recovery.  I have spent 5 minutes in review of e-visit questionnaire, review and updating patient chart, medical decision making and response to patient.   Kasee Hantz, FNP
